# Patient Record
Sex: Female | Born: 1955
Health system: Southern US, Community
[De-identification: ages and names within clinical notes are randomized; demographics above are authoritative.]

## PROBLEM LIST (undated history)

## (undated) DIAGNOSIS — E119 Type 2 diabetes mellitus without complications: Secondary | ICD-10-CM

## (undated) DIAGNOSIS — I10 Essential (primary) hypertension: Secondary | ICD-10-CM

## (undated) DIAGNOSIS — E785 Hyperlipidemia, unspecified: Secondary | ICD-10-CM

## (undated) DIAGNOSIS — R7303 Prediabetes: Secondary | ICD-10-CM

## (undated) DIAGNOSIS — J45909 Unspecified asthma, uncomplicated: Secondary | ICD-10-CM

## (undated) HISTORY — DX: Type 2 diabetes mellitus without complications: E11.9

## (undated) HISTORY — DX: Hyperlipidemia, unspecified: E78.5

## (undated) HISTORY — PX: BREAST EXCISIONAL BIOPSY: SUR124

## (undated) HISTORY — PX: ABDOMINAL HYSTERECTOMY: SHX81

---

## 1997-06-30 ENCOUNTER — Ambulatory Visit (HOSPITAL_COMMUNITY): Admission: RE | Admit: 1997-06-30 | Discharge: 1997-06-30 | Payer: Self-pay | Admitting: Obstetrics and Gynecology

## 1997-08-14 ENCOUNTER — Inpatient Hospital Stay (HOSPITAL_COMMUNITY): Admission: RE | Admit: 1997-08-14 | Discharge: 1997-08-15 | Payer: Self-pay | Admitting: Obstetrics and Gynecology

## 1999-07-07 ENCOUNTER — Ambulatory Visit (HOSPITAL_COMMUNITY): Admission: RE | Admit: 1999-07-07 | Discharge: 1999-07-07 | Payer: Self-pay | Admitting: Gastroenterology

## 1999-07-08 ENCOUNTER — Ambulatory Visit (HOSPITAL_COMMUNITY): Admission: RE | Admit: 1999-07-08 | Discharge: 1999-07-08 | Payer: Self-pay | Admitting: Gastroenterology

## 1999-10-21 ENCOUNTER — Encounter: Payer: Self-pay | Admitting: Obstetrics and Gynecology

## 1999-10-21 ENCOUNTER — Ambulatory Visit (HOSPITAL_COMMUNITY): Admission: RE | Admit: 1999-10-21 | Discharge: 1999-10-21 | Payer: Self-pay | Admitting: Obstetrics and Gynecology

## 2000-11-26 ENCOUNTER — Other Ambulatory Visit: Admission: RE | Admit: 2000-11-26 | Discharge: 2000-11-26 | Payer: Self-pay | Admitting: Obstetrics and Gynecology

## 2002-06-27 ENCOUNTER — Encounter: Admission: RE | Admit: 2002-06-27 | Discharge: 2002-08-07 | Payer: Self-pay | Admitting: Internal Medicine

## 2002-09-11 ENCOUNTER — Encounter: Payer: Self-pay | Admitting: Obstetrics and Gynecology

## 2002-09-11 ENCOUNTER — Ambulatory Visit (HOSPITAL_COMMUNITY): Admission: RE | Admit: 2002-09-11 | Discharge: 2002-09-11 | Payer: Self-pay | Admitting: Obstetrics and Gynecology

## 2004-05-04 ENCOUNTER — Encounter: Admission: RE | Admit: 2004-05-04 | Discharge: 2004-05-04 | Payer: Self-pay | Admitting: Internal Medicine

## 2010-11-23 ENCOUNTER — Other Ambulatory Visit (HOSPITAL_COMMUNITY): Payer: Self-pay | Admitting: Obstetrics and Gynecology

## 2010-11-23 DIAGNOSIS — Z1231 Encounter for screening mammogram for malignant neoplasm of breast: Secondary | ICD-10-CM

## 2010-12-08 ENCOUNTER — Ambulatory Visit (HOSPITAL_COMMUNITY)
Admission: RE | Admit: 2010-12-08 | Discharge: 2010-12-08 | Disposition: A | Discharge status: Home / Self Care | Payer: BC Managed Care – PPO | Source: Ambulatory Visit | Attending: Obstetrics and Gynecology | Admitting: Obstetrics and Gynecology

## 2010-12-08 DIAGNOSIS — Z1231 Encounter for screening mammogram for malignant neoplasm of breast: Secondary | ICD-10-CM | POA: Insufficient documentation

## 2010-12-13 ENCOUNTER — Other Ambulatory Visit: Payer: Self-pay | Admitting: Obstetrics and Gynecology

## 2010-12-13 DIAGNOSIS — R928 Other abnormal and inconclusive findings on diagnostic imaging of breast: Secondary | ICD-10-CM

## 2010-12-19 ENCOUNTER — Ambulatory Visit
Admission: RE | Admit: 2010-12-19 | Discharge: 2010-12-19 | Disposition: A | Discharge status: Home / Self Care | Payer: BC Managed Care – PPO | Source: Ambulatory Visit | Attending: Obstetrics and Gynecology | Admitting: Obstetrics and Gynecology

## 2010-12-19 DIAGNOSIS — R928 Other abnormal and inconclusive findings on diagnostic imaging of breast: Secondary | ICD-10-CM

## 2011-07-10 ENCOUNTER — Other Ambulatory Visit: Payer: Self-pay | Admitting: Obstetrics and Gynecology

## 2011-07-10 DIAGNOSIS — Z09 Encounter for follow-up examination after completed treatment for conditions other than malignant neoplasm: Secondary | ICD-10-CM

## 2011-07-10 DIAGNOSIS — N63 Unspecified lump in unspecified breast: Secondary | ICD-10-CM

## 2011-08-02 ENCOUNTER — Ambulatory Visit
Admission: RE | Admit: 2011-08-02 | Discharge: 2011-08-02 | Disposition: A | Discharge status: Home / Self Care | Payer: BC Managed Care – PPO | Source: Ambulatory Visit | Attending: Obstetrics and Gynecology | Admitting: Obstetrics and Gynecology

## 2011-08-02 DIAGNOSIS — N63 Unspecified lump in unspecified breast: Secondary | ICD-10-CM

## 2014-07-03 ENCOUNTER — Other Ambulatory Visit: Payer: Self-pay | Admitting: Family

## 2014-07-03 ENCOUNTER — Ambulatory Visit
Admission: RE | Admit: 2014-07-03 | Discharge: 2014-07-03 | Disposition: A | Discharge status: Home / Self Care | Payer: BLUE CROSS/BLUE SHIELD | Source: Ambulatory Visit | Attending: Family | Admitting: Family

## 2014-07-03 DIAGNOSIS — M25562 Pain in left knee: Secondary | ICD-10-CM

## 2014-12-30 ENCOUNTER — Other Ambulatory Visit: Payer: Self-pay | Admitting: Obstetrics and Gynecology

## 2014-12-30 DIAGNOSIS — N63 Unspecified lump in unspecified breast: Secondary | ICD-10-CM

## 2015-01-07 ENCOUNTER — Other Ambulatory Visit: Payer: Self-pay | Admitting: Obstetrics and Gynecology

## 2015-01-07 ENCOUNTER — Ambulatory Visit
Admission: RE | Admit: 2015-01-07 | Discharge: 2015-01-07 | Disposition: A | Discharge status: Home / Self Care | Payer: BLUE CROSS/BLUE SHIELD | Source: Ambulatory Visit | Attending: Obstetrics and Gynecology | Admitting: Obstetrics and Gynecology

## 2015-01-07 DIAGNOSIS — N63 Unspecified lump in unspecified breast: Secondary | ICD-10-CM

## 2015-07-06 ENCOUNTER — Other Ambulatory Visit: Payer: Self-pay | Admitting: Family

## 2015-07-06 DIAGNOSIS — N63 Unspecified lump in unspecified breast: Secondary | ICD-10-CM

## 2015-07-12 ENCOUNTER — Ambulatory Visit
Admission: RE | Admit: 2015-07-12 | Discharge: 2015-07-12 | Disposition: A | Discharge status: Home / Self Care | Payer: BLUE CROSS/BLUE SHIELD | Source: Ambulatory Visit | Attending: Family | Admitting: Family

## 2015-07-12 DIAGNOSIS — N63 Unspecified lump in unspecified breast: Secondary | ICD-10-CM

## 2015-09-30 ENCOUNTER — Encounter: Payer: Self-pay | Admitting: Pediatrics

## 2016-02-01 ENCOUNTER — Other Ambulatory Visit: Payer: Self-pay | Admitting: Family

## 2016-02-01 DIAGNOSIS — N6320 Unspecified lump in the left breast, unspecified quadrant: Secondary | ICD-10-CM

## 2016-02-07 ENCOUNTER — Other Ambulatory Visit: Payer: Self-pay

## 2016-02-07 ENCOUNTER — Other Ambulatory Visit: Payer: Self-pay | Admitting: Family

## 2016-02-07 DIAGNOSIS — N6320 Unspecified lump in the left breast, unspecified quadrant: Secondary | ICD-10-CM

## 2016-02-08 ENCOUNTER — Ambulatory Visit
Admission: RE | Admit: 2016-02-08 | Discharge: 2016-02-08 | Disposition: A | Discharge status: Home / Self Care | Payer: BLUE CROSS/BLUE SHIELD | Source: Ambulatory Visit | Attending: Family | Admitting: Family

## 2016-02-08 DIAGNOSIS — N6320 Unspecified lump in the left breast, unspecified quadrant: Secondary | ICD-10-CM

## 2017-02-01 ENCOUNTER — Other Ambulatory Visit: Payer: Self-pay | Admitting: Family

## 2017-02-01 DIAGNOSIS — Z1231 Encounter for screening mammogram for malignant neoplasm of breast: Secondary | ICD-10-CM

## 2017-02-21 ENCOUNTER — Ambulatory Visit
Admission: RE | Admit: 2017-02-21 | Discharge: 2017-02-21 | Disposition: A | Discharge status: Home / Self Care | Payer: BLUE CROSS/BLUE SHIELD | Source: Ambulatory Visit | Attending: Family | Admitting: Family

## 2017-02-21 DIAGNOSIS — Z1231 Encounter for screening mammogram for malignant neoplasm of breast: Secondary | ICD-10-CM

## 2017-02-22 ENCOUNTER — Other Ambulatory Visit: Payer: Self-pay | Admitting: Family

## 2017-02-22 DIAGNOSIS — R928 Other abnormal and inconclusive findings on diagnostic imaging of breast: Secondary | ICD-10-CM

## 2017-02-27 ENCOUNTER — Ambulatory Visit
Admission: RE | Admit: 2017-02-27 | Discharge: 2017-02-27 | Disposition: A | Discharge status: Home / Self Care | Payer: BLUE CROSS/BLUE SHIELD | Source: Ambulatory Visit | Attending: Family | Admitting: Family

## 2017-02-27 ENCOUNTER — Ambulatory Visit: Payer: BLUE CROSS/BLUE SHIELD

## 2017-02-27 ENCOUNTER — Other Ambulatory Visit: Payer: Self-pay | Admitting: Family

## 2017-02-27 DIAGNOSIS — R928 Other abnormal and inconclusive findings on diagnostic imaging of breast: Secondary | ICD-10-CM

## 2017-02-27 DIAGNOSIS — N6320 Unspecified lump in the left breast, unspecified quadrant: Secondary | ICD-10-CM

## 2017-03-08 ENCOUNTER — Other Ambulatory Visit (HOSPITAL_COMMUNITY): Payer: Self-pay | Admitting: Internal Medicine

## 2017-03-08 DIAGNOSIS — R7989 Other specified abnormal findings of blood chemistry: Secondary | ICD-10-CM

## 2017-04-26 ENCOUNTER — Encounter (HOSPITAL_COMMUNITY)
Admission: RE | Admit: 2017-04-26 | Discharge: 2017-04-26 | Disposition: A | Discharge status: Home / Self Care | Payer: BLUE CROSS/BLUE SHIELD | Source: Ambulatory Visit | Attending: Internal Medicine | Admitting: Internal Medicine

## 2017-04-26 DIAGNOSIS — R7989 Other specified abnormal findings of blood chemistry: Secondary | ICD-10-CM | POA: Diagnosis not present

## 2017-04-27 ENCOUNTER — Encounter (HOSPITAL_COMMUNITY)
Admission: RE | Admit: 2017-04-27 | Discharge: 2017-04-27 | Disposition: A | Discharge status: Home / Self Care | Payer: BLUE CROSS/BLUE SHIELD | Source: Ambulatory Visit | Attending: Internal Medicine | Admitting: Internal Medicine

## 2017-04-27 MED ORDER — SODIUM PERTECHNETATE TC 99M INJECTION
10.0000 | Freq: Once | INTRAVENOUS | Status: AC | PRN
  Administered 2017-04-27: 10 via INTRAVENOUS

## 2017-04-27 MED ORDER — SODIUM IODIDE I 131 CAPSULE
11.9000 | Freq: Once | INTRAVENOUS | Status: AC | PRN
  Administered 2017-04-27: 11.9 via ORAL

## 2017-05-01 ENCOUNTER — Other Ambulatory Visit: Payer: Self-pay | Admitting: Internal Medicine

## 2017-05-01 DIAGNOSIS — E041 Nontoxic single thyroid nodule: Secondary | ICD-10-CM

## 2017-05-07 ENCOUNTER — Other Ambulatory Visit: Payer: BLUE CROSS/BLUE SHIELD

## 2017-05-14 ENCOUNTER — Ambulatory Visit
Admission: RE | Admit: 2017-05-14 | Discharge: 2017-05-14 | Disposition: A | Discharge status: Home / Self Care | Payer: BLUE CROSS/BLUE SHIELD | Source: Ambulatory Visit | Attending: Internal Medicine | Admitting: Internal Medicine

## 2017-05-14 DIAGNOSIS — E041 Nontoxic single thyroid nodule: Secondary | ICD-10-CM

## 2017-05-16 ENCOUNTER — Other Ambulatory Visit: Payer: Self-pay | Admitting: Internal Medicine

## 2017-05-16 DIAGNOSIS — E042 Nontoxic multinodular goiter: Secondary | ICD-10-CM

## 2017-06-08 ENCOUNTER — Other Ambulatory Visit (HOSPITAL_COMMUNITY)
Admission: RE | Admit: 2017-06-08 | Discharge: 2017-06-08 | Disposition: A | Discharge status: Home / Self Care | Payer: BLUE CROSS/BLUE SHIELD | Source: Ambulatory Visit | Attending: Student | Admitting: Student

## 2017-06-08 ENCOUNTER — Ambulatory Visit
Admission: RE | Admit: 2017-06-08 | Discharge: 2017-06-08 | Disposition: A | Discharge status: Home / Self Care | Payer: BLUE CROSS/BLUE SHIELD | Source: Ambulatory Visit | Attending: Internal Medicine | Admitting: Internal Medicine

## 2017-06-08 DIAGNOSIS — E042 Nontoxic multinodular goiter: Secondary | ICD-10-CM

## 2017-09-12 ENCOUNTER — Ambulatory Visit
Admission: RE | Admit: 2017-09-12 | Discharge: 2017-09-12 | Disposition: A | Discharge status: Home / Self Care | Payer: BLUE CROSS/BLUE SHIELD | Source: Ambulatory Visit | Attending: Nurse Practitioner | Admitting: Nurse Practitioner

## 2017-09-12 ENCOUNTER — Other Ambulatory Visit: Payer: Self-pay | Admitting: Nurse Practitioner

## 2017-09-12 DIAGNOSIS — M25561 Pain in right knee: Secondary | ICD-10-CM

## 2017-12-22 IMAGING — US US THYROID
1 series · 15 of 25 positions shown · non-contrast
Comparison: Nuclear medicine scan 04/27/2017

CLINICAL DATA: 61-year-old female with a history of left-sided
thyroid nodule identified on recent nuclear medicine thyroid scan as
a cold nodule

EXAM:
THYROID ULTRASOUND
TECHNIQUE: Ultrasound examination of the thyroid gland and adjacent soft
tissues was performed.

[Series 1: us thyroid · 0.08mm/px · 15 of 109 slices shown]
[im 1/109]
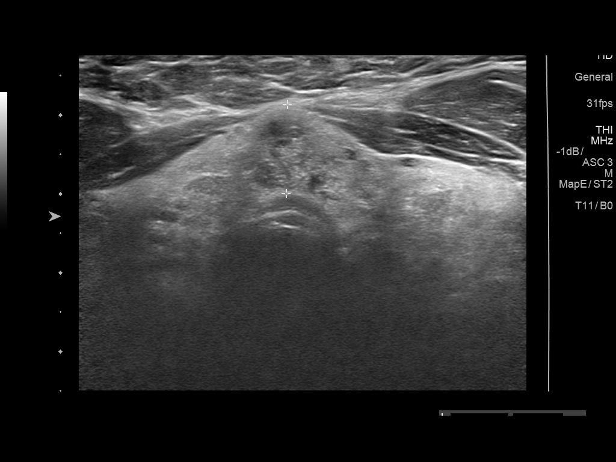
[im 10/109]
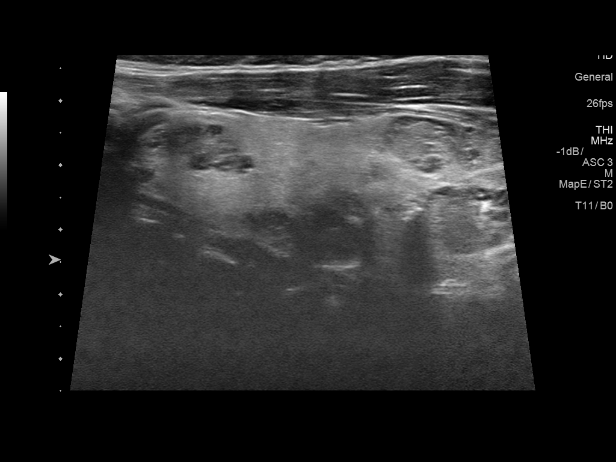
[im 19/109]
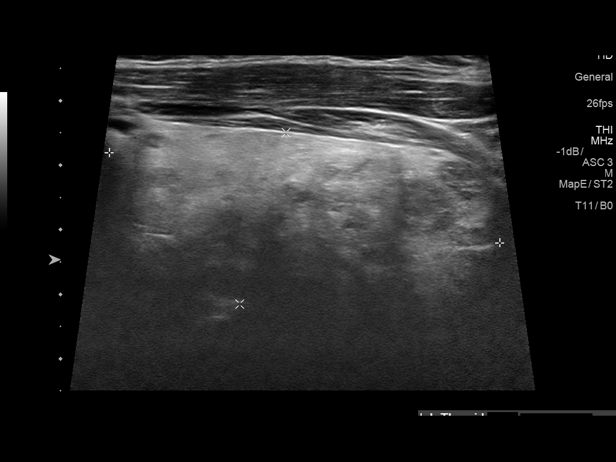
[im 23/109]
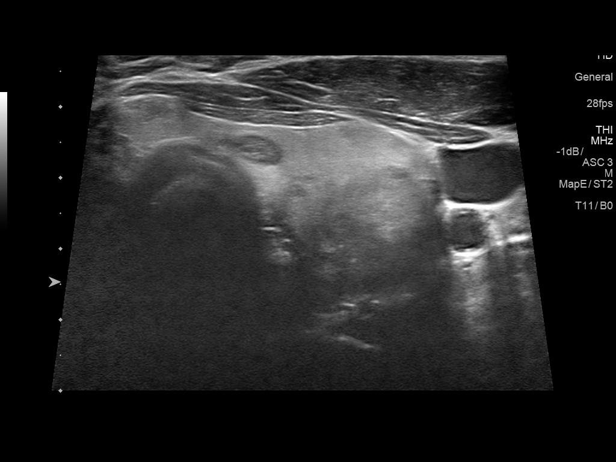
[im 32/109]
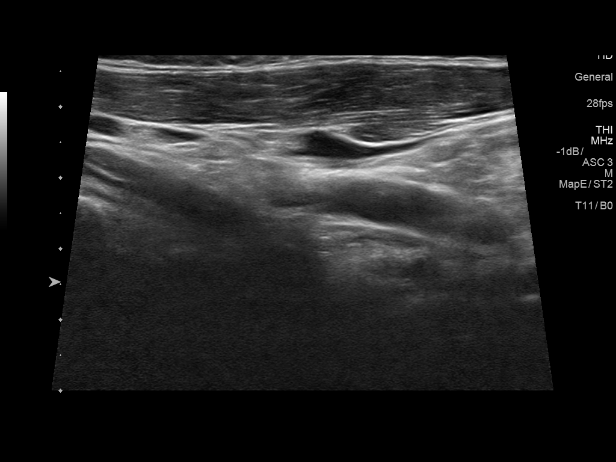
[im 41/109]
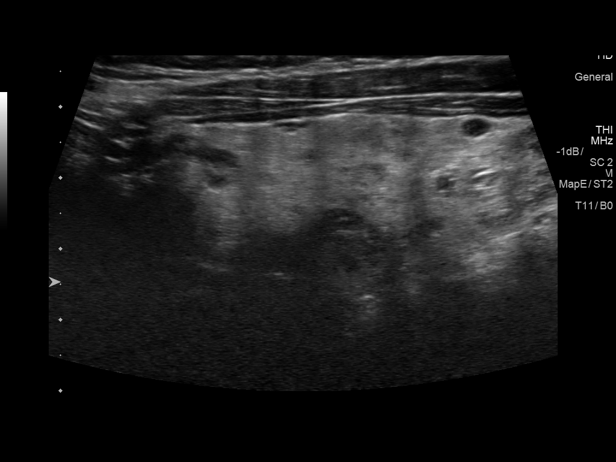
[im 46/109]
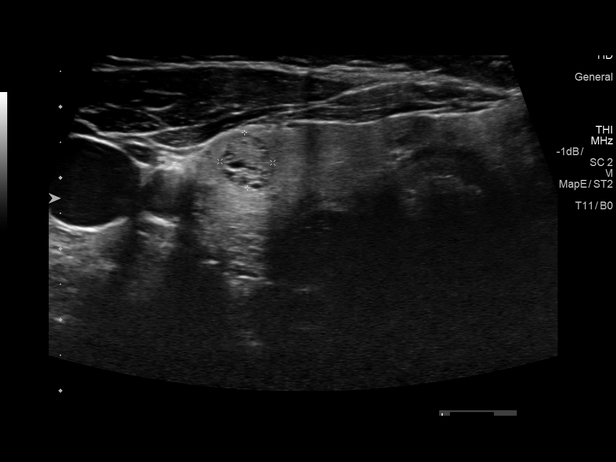
[im 55/109]
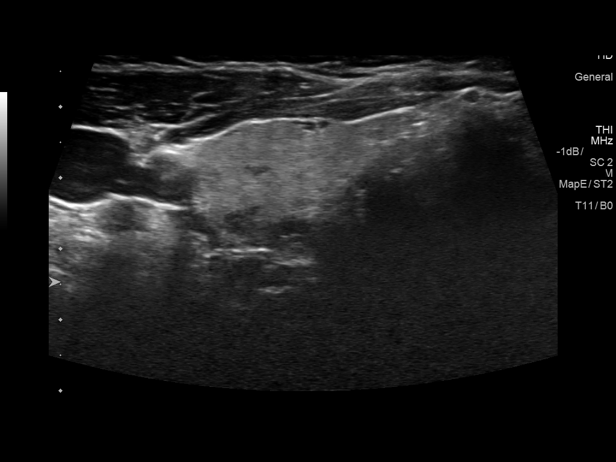
[im 64/109]
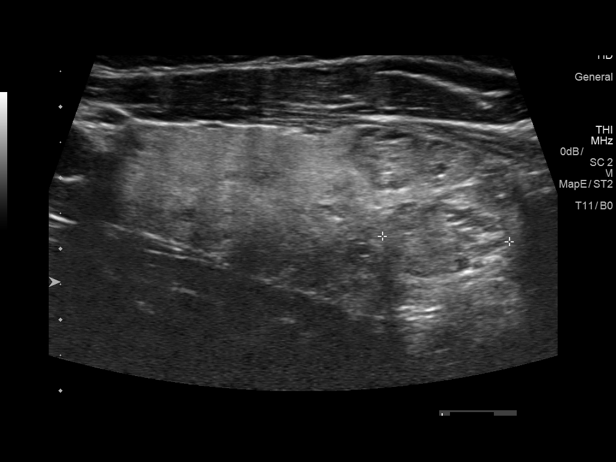
[im 68/109]
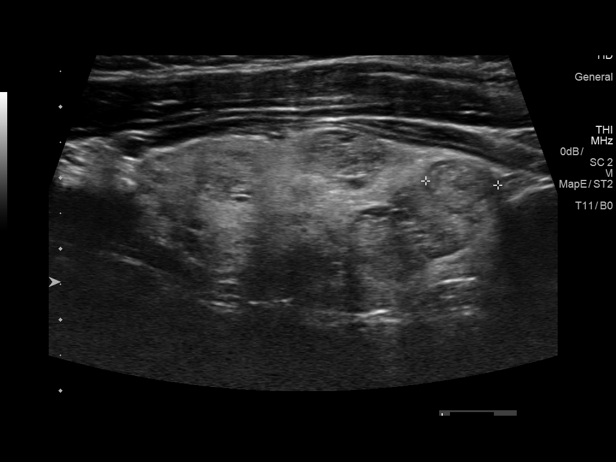
[im 77/109]
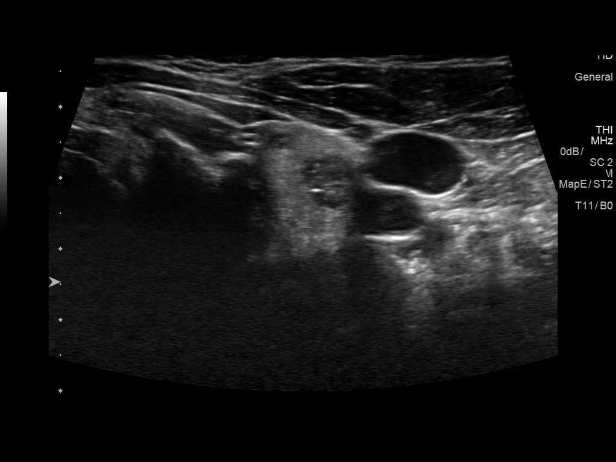
[im 86/109]
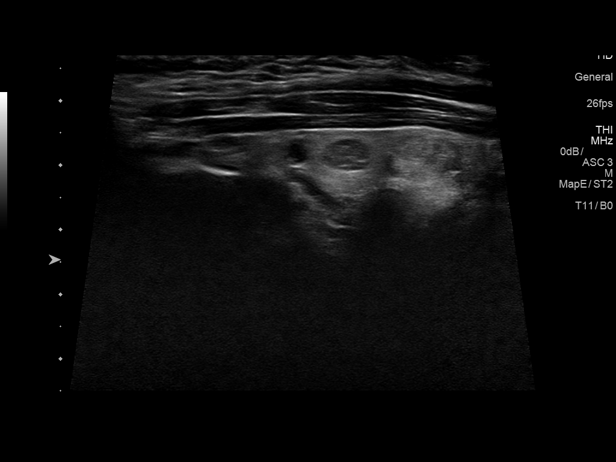
[im 91/109]
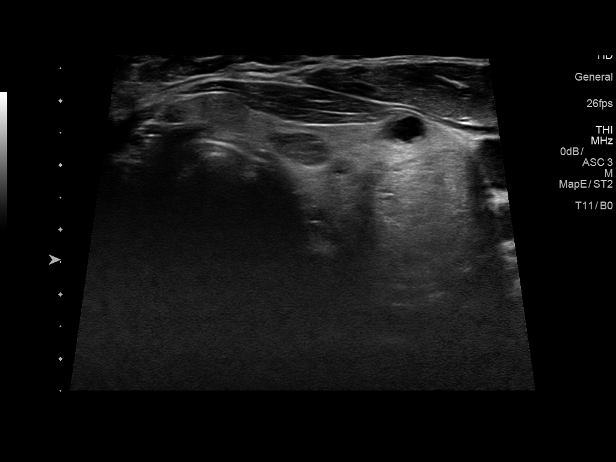
[im 100/109]
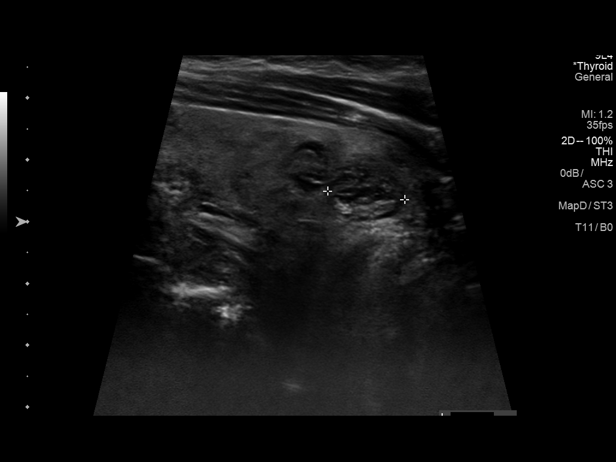
[im 109/109]
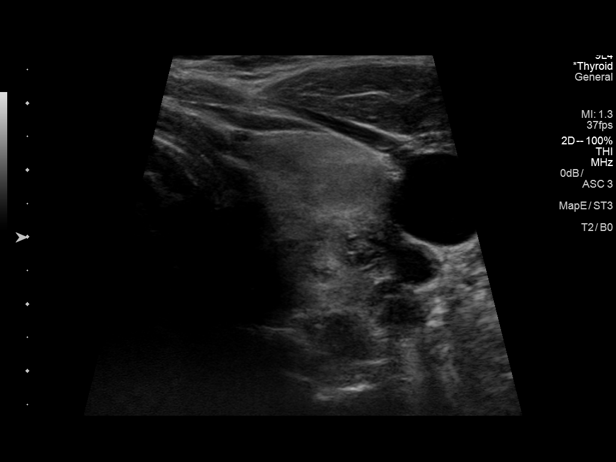

[15 of 25 positions shown; findings below may reference images not displayed]

FINDINGS: Parenchymal Echotexture: Mildly heterogenous

Isthmus: 1.1 cm

Right lobe: 6.5 cm x 2.3 cm x 2.6 cm

Left lobe: 6.2 cm x 2.8 cm x 2.7 cm

_________________________________________________________

Estimated total number of nodules >/= 1 cm: 6-10

Number of spongiform nodules >/=  2 cm not described below (TR1): 0

Number of mixed cystic and solid nodules >/= 1.5 cm not described
below (TR2): 0

_________________________________________________________

Nodule # 1:

Location: Isthmus; Mid

Maximum size: 0.6 cm; Other 2 dimensions: 0.4 cm x 0.6 cm

Composition: cannot determine (2)

Echogenicity: hypoechoic (2)

Shape: not taller-than-wide (0)

Margins: smooth (0)

Echogenic foci: none (0)

ACR TI-RADS total points: 4.

ACR TI-RADS risk category: TR4 (4-6 points).

ACR TI-RADS recommendations:

Nodule does not meet criteria for surveillance or biopsy

_________________________________________________________

Nodule # 2:

Location: Right; Superior

Maximum size: 1.2 cm; Other 2 dimensions: 0.5 cm x 0.8 cm

Composition: spongiform (0)

ACR TI-RADS recommendations:

Spongiform nodule does not meet criteria for surveillance or biopsy

_________________________________________________________

Nodule # 3:

Location: Right; Mid

Maximum size: 1.0 cm; Other 2 dimensions: 0.9 cm x 0.9 cm

Composition: cannot determine (2)

Echogenicity: hypoechoic (2)

Shape: not taller-than-wide (0)

Margins: ill-defined (0)

Echogenic foci: none (0)

ACR TI-RADS total points: 4.

ACR TI-RADS risk category: TR4 (4-6 points).

ACR TI-RADS recommendations:

Nodule meets criteria for surveillance

_________________________________________________________

Nodule # 4:

Location: Right; Inferior

Maximum size: 1.9 cm; Other 2 dimensions: 1.3 cm x 0.9 cm

Composition: solid/almost completely solid (2)

Echogenicity: isoechoic (1)

Shape: not taller-than-wide (0)

Margins: ill-defined (0)

Echogenic foci: none (0)

ACR TI-RADS total points: 3.

ACR TI-RADS risk category: TR3 (3 points).

ACR TI-RADS recommendations:

Nodule meets criteria for surveillance

_________________________________________________________

Nodule # 5:

Location: Right; Inferior

Maximum size: 1.8 cm; Other 2 dimensions: 1.0 cm x 1.6 cm

Composition: solid/almost completely solid (2)

Echogenicity: isoechoic (1)

Shape: not taller-than-wide (0)

Margins: ill-defined (0)

Echogenic foci: macrocalcifications (1)

ACR TI-RADS total points: 4.

ACR TI-RADS risk category: TR4 (4-6 points).

ACR TI-RADS recommendations:

Nodule meets criteria for biopsy

_________________________________________________________

Nodule # 6:

Location: Left; Superior

Maximum size: 0.8 cm; Other 2 dimensions: 0.8 cm x 0.8 cm

Composition: cannot determine (2)

Echogenicity: hypoechoic (2)

Shape: not taller-than-wide (0)

Margins: smooth (0)

Echogenic foci: none (0)

ACR TI-RADS total points: 4.

ACR TI-RADS risk category: TR4 (4-6 points).

ACR TI-RADS recommendations:

Nodule does not meet criteria for surveillance or biopsy

_________________________________________________________

Nodule # 7:

Location: Left; Mid

Maximum size: 1.6 cm; Other 2 dimensions: 1.2 cm x 1.2 cm

Composition: solid/almost completely solid (2)

Echogenicity: isoechoic (1)

Shape: not taller-than-wide (0)

Margins: ill-defined (0)

Echogenic foci: punctate echogenic foci (3)

ACR TI-RADS total points: 6.

ACR TI-RADS risk category: TR4 (4-6 points).

ACR TI-RADS recommendations:

Nodule meets criteria for biopsy

_________________________________________________________

Nodule # 8:

Location: Left; Mid

Maximum size: 2.2 cm; Other 2 dimensions: 1.7 cm x 2.0 cm

Composition: solid/almost completely solid (2)

Echogenicity: isoechoic (1)

Shape: not taller-than-wide (0)

Margins: ill-defined (0)

Echogenic foci: none (0)

ACR TI-RADS total points: 3.

ACR TI-RADS risk category: TR3 (3 points).

ACR TI-RADS recommendations:

Nodule meets criteria for surveillance

_________________________________________________________

Nodule # 9:

Location: Left; Inferior

Maximum size: 1.3 cm; Other 2 dimensions: 1.2 cm x 1.2 cm

Composition: spongiform (0)

ACR TI-RADS recommendations:

Spongiform nodule does not meet criteria for surveillance or biopsy

_________________________________________________________

No adenopathy
IMPRESSION: Two separate thyroid nodules meet criteria for biopsy, as designated
by the newly established ACR TI-RADS criteria, and referral for
biopsy is recommended. The first is the presumed cold nodule in the
mid left thyroid gland (labeled 7). The second is inferior right
thyroid nodule (labeled 5).

Three separate thyroid nodules meet criteria for surveillance, as
designated by the newly established ACR TI-RADS criteria.
Surveillance ultrasound study recommended to be performed annually
up to 5 years. Two on the right (labeled 3 and 4) and single left
(labeled 8).

Remainder of the nodules do not meet criteria for surveillance or
biopsy.

Recommendations follow those established by the new ACR TI-RADS
criteria ([HOSPITAL] 4297;[DATE]).

## 2018-03-04 ENCOUNTER — Other Ambulatory Visit: Payer: Self-pay | Admitting: Family

## 2018-03-04 DIAGNOSIS — Z1231 Encounter for screening mammogram for malignant neoplasm of breast: Secondary | ICD-10-CM

## 2018-03-04 DIAGNOSIS — R928 Other abnormal and inconclusive findings on diagnostic imaging of breast: Secondary | ICD-10-CM

## 2018-03-12 ENCOUNTER — Ambulatory Visit
Admission: RE | Admit: 2018-03-12 | Discharge: 2018-03-12 | Disposition: A | Discharge status: Home / Self Care | Payer: BLUE CROSS/BLUE SHIELD | Source: Ambulatory Visit | Attending: Family | Admitting: Family

## 2018-03-12 ENCOUNTER — Other Ambulatory Visit: Payer: BLUE CROSS/BLUE SHIELD

## 2018-03-12 DIAGNOSIS — Z1231 Encounter for screening mammogram for malignant neoplasm of breast: Secondary | ICD-10-CM

## 2018-06-04 ENCOUNTER — Emergency Department (HOSPITAL_COMMUNITY): Payer: BLUE CROSS/BLUE SHIELD

## 2018-06-04 ENCOUNTER — Emergency Department (HOSPITAL_COMMUNITY)
Admission: EM | Admit: 2018-06-04 | Discharge: 2018-06-05 | Disposition: A | Discharge status: Home / Self Care | Payer: BLUE CROSS/BLUE SHIELD | Attending: Emergency Medicine | Admitting: Emergency Medicine

## 2018-06-04 ENCOUNTER — Encounter (HOSPITAL_COMMUNITY): Payer: Self-pay | Admitting: Emergency Medicine

## 2018-06-04 DIAGNOSIS — R05 Cough: Secondary | ICD-10-CM | POA: Insufficient documentation

## 2018-06-04 DIAGNOSIS — R0789 Other chest pain: Secondary | ICD-10-CM | POA: Diagnosis not present

## 2018-06-04 DIAGNOSIS — R059 Cough, unspecified: Secondary | ICD-10-CM

## 2018-06-04 LAB — CBC
HCT: 40.2 % (ref 36.0–46.0)
Hemoglobin: 12.8 g/dL (ref 12.0–15.0)
MCH: 29.2 pg (ref 26.0–34.0)
MCHC: 31.8 g/dL (ref 30.0–36.0)
MCV: 91.8 fL (ref 80.0–100.0)
PLATELETS: 285 10*3/uL (ref 150–400)
RBC: 4.38 MIL/uL (ref 3.87–5.11)
RDW: 13.8 % (ref 11.5–15.5)
WBC: 9.1 10*3/uL (ref 4.0–10.5)
nRBC: 0 % (ref 0.0–0.2)

## 2018-06-04 LAB — I-STAT TROPONIN, ED: Troponin i, poc: 0 ng/mL (ref 0.00–0.08)

## 2018-06-04 LAB — BASIC METABOLIC PANEL
Anion gap: 10 (ref 5–15)
BUN: 9 mg/dL (ref 8–23)
CALCIUM: 9.9 mg/dL (ref 8.9–10.3)
CO2: 27 mmol/L (ref 22–32)
Chloride: 102 mmol/L (ref 98–111)
Creatinine, Ser: 0.71 mg/dL (ref 0.44–1.00)
GFR calc Af Amer: >60 mL/min (ref >60)
GFR calc non Af Amer: >60 mL/min (ref >60)
Glucose, Bld: 131 mg/dL — ABNORMAL HIGH (ref 70–99)
Potassium: 3.7 mmol/L (ref 3.5–5.1)
Sodium: 139 mmol/L (ref 135–145)

## 2018-06-04 NOTE — ED Triage Notes (Signed)
Pt reports substernal chest pain when she is coughing.  Pt has had a productive cough for 2 weeks.

## 2018-06-05 MED ORDER — BENZONATATE 100 MG PO CAPS: 200.0000 mg | ORAL_CAPSULE | Freq: Two times a day (BID) | ORAL | 0 refills | Status: DC | PRN

## 2018-06-05 MED ORDER — HYDROCODONE-HOMATROPINE 5-1.5 MG/5ML PO SYRP: 5.0000 mL | ORAL_SOLUTION | Freq: Four times a day (QID) | ORAL | 0 refills | Status: DC | PRN

## 2018-06-05 NOTE — ED Provider Notes (Signed)
[facility] EMERGENCY DEPARTMENT Provider Note   CSN: [id number] Arrival date & time: 06/04/18  2216     History   Chief Complaint Chief Complaint  Patient presents with  . Cough  . Chest Pain    HPI Donna Cruz is a 63 y.o. female.  Patient presents to the emergency department with a chief complaint of cough x2 weeks.  She denies any associated fevers or chills.  Denies any productive cough.  Denies any shortness of breath.  She states that when she coughs it causes her chest to hurt.  She also states that she was lifting something the other day, and may have injured her chest.  She has tried taking OTC cough and cold medicines with little relief.  Symptoms are also worsened with palpation of her chest wall.  Denies any history of ACS, PE, or DVT.  The history is provided by the patient. No language interpreter was used.    History reviewed. No pertinent past medical history.  There are no active problems to display for this patient.   Past Surgical History:  Procedure Laterality Date  . BREAST EXCISIONAL BIOPSY Bilateral    benign     OB History   No obstetric history on file.      Home Medications    Prior to Admission medications   Not on File    Family History No family history on file.  Social History Social History   Tobacco Use  . Smoking status: Not on file  Substance Use Topics  . Alcohol use: Not on file  . Drug use: Not on file     Allergies   Patient has no known allergies.   Review of Systems Review of Systems  All other systems reviewed and are negative.    Physical Exam Updated Vital Signs BP (!) 175/81   Pulse 92   Temp 98.4 F (36.9 C) (Oral)   Resp 19   SpO2 99%   Physical Exam Vitals signs and nursing note reviewed.  Constitutional:      Appearance: She is well-developed.  HENT:     Head: Normocephalic and atraumatic.  Eyes:     Conjunctiva/sclera: Conjunctivae normal.     Pupils:  Pupils are equal, round, and reactive to light.  Neck:     Musculoskeletal: Normal range of motion and neck supple.  Cardiovascular:     Rate and Rhythm: Normal rate and regular rhythm.     Heart sounds: No murmur. No friction rub. No gallop.      Comments: Anterior chest wall tender to palpation Pulmonary:     Effort: Pulmonary effort is normal. No respiratory distress.     Breath sounds: Normal breath sounds. No wheezing or rales.     Comments: Lungs are clear to auscultation Chest:     Chest wall: No tenderness.  Abdominal:     General: Bowel sounds are normal. There is no distension.     Palpations: Abdomen is soft. There is no mass.     Tenderness: There is no abdominal tenderness. There is no guarding or rebound.  Musculoskeletal: Normal range of motion.        General: No tenderness.  Skin:    General: Skin is warm and dry.  Neurological:     Mental Status: She is alert and oriented to person, place, and time.  Psychiatric:        Behavior: Behavior normal.        Thought Content:  Thought content normal.        Judgment: Judgment normal.      ED Treatments / Results  Labs (all labs ordered are listed, but only abnormal results are displayed) Labs Reviewed  BASIC METABOLIC PANEL - Abnormal; Notable for the following components:      Result Value   Glucose, Bld 131 (*)    All other components within normal limits  CBC  I-STAT TROPONIN, ED    EKG None  Radiology Dg Chest 2 View  Result Date: 06/04/2018 CLINICAL DATA:  Chest pain cough and congestion EXAM: CHEST - 2 VIEW COMPARISON:  None. FINDINGS: The heart size and mediastinal contours are within normal limits. Both lungs are clear. Mild aortic atherosclerosis. Mild degenerative changes of the spine. IMPRESSION: No active cardiopulmonary disease. Electronically Signed   By: Donavan Foil M.D.   On: 06/04/2018 22:52    Procedures Procedures (including critical care time)  Medications Ordered in  ED Medications - No data to display   Initial Impression / Assessment and Plan / ED Course  I have reviewed the triage vital signs and the nursing notes.  Pertinent labs & imaging results that were available during my care of the patient were reviewed by me and considered in my medical decision making (see chart for details).     Patient with cough x2 weeks.  The cough causes her chest to hurt.  Her laboratory work-up is reassuring.  Troponin is 0.00.  No ischemic changes on EKG.  Electrolytes and CBC are normal.  Vital signs are stable.  Patient is not hypoxic, nor tachycardic, doubt PE. Doubt ACS.  Symptoms are easily reproducible with palpation of her anterior chest wall.  I believe her symptoms to be musculoskeletal, and secondary to her coughing.  Will prescribe Tessalon Perles.  She is taking meloxicam.  Advised her to follow-up with her PCP.  I do not believe that any further emergent work-up is needed.  Final Clinical Impressions(s) / ED Diagnoses   Final diagnoses:  Cough  Chest wall pain    ED Discharge Orders         Ordered    HYDROcodone-homatropine (HYCODAN) 5-1.5 MG/5ML syrup  Every 6 hours PRN     06/05/18 0524    benzonatate (TESSALON) 100 MG capsule  2 times daily PRN     06/05/18 0524           Montine Circle, PA-C 06/05/18 0525    Ward, Delice Bison, DO 06/05/18 [phone]

## 2018-06-05 NOTE — ED Notes (Signed)
Patient verbalizes understanding of discharge instructions. Opportunity for questioning and answers were provided. Armband removed by staff, pt discharged from ED. Ambulated out to lobby with family ? ?

## 2018-07-01 DIAGNOSIS — Z6841 Body Mass Index (BMI) 40.0 and over, adult: Secondary | ICD-10-CM | POA: Diagnosis not present

## 2018-07-01 DIAGNOSIS — E079 Disorder of thyroid, unspecified: Secondary | ICD-10-CM | POA: Diagnosis not present

## 2018-07-01 DIAGNOSIS — I10 Essential (primary) hypertension: Secondary | ICD-10-CM | POA: Diagnosis not present

## 2018-07-01 DIAGNOSIS — M25511 Pain in right shoulder: Secondary | ICD-10-CM | POA: Diagnosis not present

## 2018-07-01 DIAGNOSIS — Z79899 Other long term (current) drug therapy: Secondary | ICD-10-CM | POA: Diagnosis not present

## 2018-07-15 DIAGNOSIS — R946 Abnormal results of thyroid function studies: Secondary | ICD-10-CM | POA: Diagnosis not present

## 2018-07-15 DIAGNOSIS — E042 Nontoxic multinodular goiter: Secondary | ICD-10-CM | POA: Diagnosis not present

## 2018-07-31 ENCOUNTER — Ambulatory Visit
Admission: RE | Admit: 2018-07-31 | Discharge: 2018-07-31 | Disposition: A | Discharge status: Home / Self Care | Payer: Medicare Other | Source: Ambulatory Visit | Attending: Family | Admitting: Family

## 2018-07-31 ENCOUNTER — Other Ambulatory Visit: Payer: Self-pay | Admitting: Family

## 2018-07-31 DIAGNOSIS — R269 Unspecified abnormalities of gait and mobility: Secondary | ICD-10-CM

## 2018-07-31 DIAGNOSIS — M25572 Pain in left ankle and joints of left foot: Secondary | ICD-10-CM

## 2018-07-31 DIAGNOSIS — M7662 Achilles tendinitis, left leg: Secondary | ICD-10-CM | POA: Diagnosis not present

## 2018-09-27 DIAGNOSIS — Z8349 Family history of other endocrine, nutritional and metabolic diseases: Secondary | ICD-10-CM | POA: Diagnosis not present

## 2018-09-27 DIAGNOSIS — R946 Abnormal results of thyroid function studies: Secondary | ICD-10-CM | POA: Diagnosis not present

## 2018-09-27 DIAGNOSIS — E042 Nontoxic multinodular goiter: Secondary | ICD-10-CM | POA: Diagnosis not present

## 2018-12-20 DIAGNOSIS — Z20828 Contact with and (suspected) exposure to other viral communicable diseases: Secondary | ICD-10-CM | POA: Diagnosis not present

## 2019-03-20 DIAGNOSIS — E038 Other specified hypothyroidism: Secondary | ICD-10-CM | POA: Diagnosis not present

## 2019-04-21 DIAGNOSIS — I10 Essential (primary) hypertension: Secondary | ICD-10-CM | POA: Diagnosis not present

## 2019-04-21 DIAGNOSIS — E069 Thyroiditis, unspecified: Secondary | ICD-10-CM | POA: Diagnosis not present

## 2019-07-09 DIAGNOSIS — R946 Abnormal results of thyroid function studies: Secondary | ICD-10-CM | POA: Diagnosis not present

## 2019-07-09 DIAGNOSIS — E042 Nontoxic multinodular goiter: Secondary | ICD-10-CM | POA: Diagnosis not present

## 2019-07-17 DIAGNOSIS — R002 Palpitations: Secondary | ICD-10-CM | POA: Diagnosis not present

## 2019-07-17 DIAGNOSIS — E0590 Thyrotoxicosis, unspecified without thyrotoxic crisis or storm: Secondary | ICD-10-CM | POA: Diagnosis not present

## 2019-07-17 DIAGNOSIS — R1310 Dysphagia, unspecified: Secondary | ICD-10-CM | POA: Diagnosis not present

## 2019-07-17 DIAGNOSIS — Z8349 Family history of other endocrine, nutritional and metabolic diseases: Secondary | ICD-10-CM | POA: Diagnosis not present

## 2019-07-17 DIAGNOSIS — E042 Nontoxic multinodular goiter: Secondary | ICD-10-CM | POA: Diagnosis not present

## 2019-08-19 ENCOUNTER — Ambulatory Visit: Payer: Self-pay | Admitting: Surgery

## 2019-08-19 DIAGNOSIS — E0590 Thyrotoxicosis, unspecified without thyrotoxic crisis or storm: Secondary | ICD-10-CM | POA: Diagnosis not present

## 2019-08-19 DIAGNOSIS — E049 Nontoxic goiter, unspecified: Secondary | ICD-10-CM | POA: Diagnosis not present

## 2019-08-19 DIAGNOSIS — E042 Nontoxic multinodular goiter: Secondary | ICD-10-CM | POA: Diagnosis not present

## 2019-08-27 NOTE — Patient Instructions (Signed)
DUE TO COVID-19 ONLY ONE VISITOR IS ALLOWED TO COME WITH YOU AND STAY IN THE WAITING ROOM ONLY DURING PRE OP AND PROCEDURE DAY OF SURGERY. THE 1 VISITOR MAY VISIT WITH YOU AFTER SURGERY IN YOUR PRIVATE ROOM DURING VISITING HOURS ONLY!  YOU NEED TO HAVE A COVID 19 TEST ON :08/28/19 @11 :00 am, THIS TEST MUST BE DONE BEFORE SURGERY, COME  [place], [place] , [address].  ([place]) ONCE YOUR COVID TEST IS COMPLETED, PLEASE BEGIN THE QUARANTINE INSTRUCTIONS AS OUTLINED IN YOUR HANDOUT.                Donna Cruz    Your procedure is scheduled on: 09/01/19   Report to [facility] Main  Entrance   Report to admitting at: 11:45 AM     Call this number if you have problems the morning of surgery [phone]    Remember: Do not eat solid food :After Midnight. Clear liquid diet from midnight until 10:45 am.   CLEAR LIQUID DIET   Foods Allowed                                                                     Foods Excluded  Coffee and tea, regular and decaf                             liquids that you cannot  Plain Jell-O any favor except red or purple                                           see through such as: Fruit ices (not with fruit pulp)                                     milk, soups, orange juice  Iced Popsicles                                    All solid food Carbonated beverages, regular and diet                                    Cranberry, grape and apple juices Sports drinks like Gatorade Lightly seasoned clear broth or consume(fat free) Sugar, honey syrup  Sample Menu Breakfast                                Lunch                                     Supper Cranberry juice                    Beef broth  Chicken broth Jell-O                                     Grape juice                           Apple juice Coffee or tea                        Jell-O                                      Popsicle                                                 Coffee or tea                        Coffee or tea  _____________________________________________________________________   Take these medicines the morning of surgery with A SIP OF WATER: N/A  BRUSH YOUR TEETH MORNING OF SURGERY AND RINSE YOUR MOUTH OUT, NO CHEWING GUM CANDY OR MINTS.                               You may not have any metal on your body including hair pins and              piercings  Do not wear jewelry, make-up, lotions, powders or perfumes, deodorant             Do not wear nail polish on your fingernails.  Do not shave  48 hours prior to surgery.                Do not bring valuables to the hospital. [place].  Contacts, dentures or bridgework may not be worn into surgery.  Leave suitcase in the car. After surgery it may be brought to your room.     Patients discharged the day of surgery will not be allowed to drive home. IF YOU ARE HAVING SURGERY AND GOING HOME THE SAME DAY, YOU MUST HAVE AN ADULT TO DRIVE YOU HOME AND BE WITH YOU FOR 24 HOURS. YOU MAY GO HOME BY TAXI OR UBER OR ORTHERWISE, BUT AN ADULT MUST ACCOMPANY YOU HOME AND STAY WITH YOU FOR 24 HOURS.  Name and phone number of your driver:  Special Instructions: N/A              Please read over the following fact sheets you were give        [facility] - Preparing for Surgery Before surgery, you can play an important role.  Because skin is not sterile, your skin needs to be as free of germs as possible.  You can reduce the number of germs on your skin by washing with CHG (chlorahexidine gluconate) soap before surgery.  CHG is an antiseptic cleaner which kills germs and bonds with the skin to continue killing germs even after washing. Please DO NOT use if  you have an allergy to CHG or antibacterial soaps.  If your skin becomes reddened/irritated stop using the CHG and inform your nurse when you arrive at Short Stay. Do  not shave (including legs and underarms) for at least 48 hours prior to the first CHG shower.  You may shave your face/neck. Please follow these instructions carefully:  1.  Shower with CHG Soap the night before surgery and the  morning of Surgery.  2.  If you choose to wash your hair, wash your hair first as usual with your  normal  shampoo.  3.  After you shampoo, rinse your hair and body thoroughly to remove the  shampoo.                           4.  Use CHG as you would any other liquid soap.  You can apply chg directly  to the skin and wash                       Gently with a scrungie or clean washcloth.  5.  Apply the CHG Soap to your body ONLY FROM THE NECK DOWN.   Do not use on face/ open                           Wound or open sores. Avoid contact with eyes, ears mouth and genitals (private parts).                       Wash face,  Genitals (private parts) with your normal soap.             6.  Wash thoroughly, paying special attention to the area where your surgery  will be performed.  7.  Thoroughly rinse your body with warm water from the neck down.  8.  DO NOT shower/wash with your normal soap after using and rinsing off  the CHG Soap.                9.  Pat yourself dry with a clean towel.            10.  Wear clean pajamas.            11.  Place clean sheets on your bed the night of your first shower and do not  sleep with pets. Day of Surgery : Do not apply any lotions/deodorants the morning of surgery.  Please wear clean clothes to the hospital/surgery center.  FAILURE TO FOLLOW THESE INSTRUCTIONS MAY RESULT IN THE CANCELLATION OF YOUR SURGERY PATIENT SIGNATURE_________________________________  NURSE SIGNATURE__________________________________  ________________________________________________________________________

## 2019-08-28 ENCOUNTER — Ambulatory Visit (HOSPITAL_COMMUNITY)
Admission: RE | Admit: 2019-08-28 | Discharge: 2019-08-28 | Disposition: A | Discharge status: Home / Self Care | Payer: Medicare Other | Source: Ambulatory Visit | Attending: Anesthesiology | Admitting: Anesthesiology

## 2019-08-28 ENCOUNTER — Encounter (HOSPITAL_COMMUNITY)
Admission: RE | Admit: 2019-08-28 | Discharge: 2019-08-28 | Disposition: A | Discharge status: Home / Self Care | Payer: Medicare Other | Source: Ambulatory Visit | Attending: Surgery | Admitting: Surgery

## 2019-08-28 ENCOUNTER — Other Ambulatory Visit: Payer: Self-pay

## 2019-08-28 ENCOUNTER — Other Ambulatory Visit (HOSPITAL_COMMUNITY)
Admission: RE | Admit: 2019-08-28 | Discharge: 2019-08-28 | Disposition: A | Discharge status: Home / Self Care | Payer: Medicare Other | Source: Ambulatory Visit | Attending: Surgery | Admitting: Surgery

## 2019-08-28 ENCOUNTER — Encounter (HOSPITAL_COMMUNITY): Payer: Self-pay

## 2019-08-28 DIAGNOSIS — I10 Essential (primary) hypertension: Secondary | ICD-10-CM | POA: Insufficient documentation

## 2019-08-28 DIAGNOSIS — E079 Disorder of thyroid, unspecified: Secondary | ICD-10-CM | POA: Diagnosis not present

## 2019-08-28 DIAGNOSIS — Z01818 Encounter for other preprocedural examination: Secondary | ICD-10-CM | POA: Diagnosis not present

## 2019-08-28 DIAGNOSIS — Z20822 Contact with and (suspected) exposure to covid-19: Secondary | ICD-10-CM | POA: Insufficient documentation

## 2019-08-28 HISTORY — DX: Unspecified asthma, uncomplicated: J45.909

## 2019-08-28 HISTORY — DX: Essential (primary) hypertension: I10

## 2019-08-28 HISTORY — DX: Prediabetes: R73.03

## 2019-08-28 LAB — CBC
HCT: 44 % (ref 36.0–46.0)
Hemoglobin: 14.1 g/dL (ref 12.0–15.0)
MCH: 29.2 pg (ref 26.0–34.0)
MCHC: 32 g/dL (ref 30.0–36.0)
MCV: 91.1 fL (ref 80.0–100.0)
Platelets: 251 10*3/uL (ref 150–400)
RBC: 4.83 MIL/uL (ref 3.87–5.11)
RDW: 14.1 % (ref 11.5–15.5)
WBC: 5.8 10*3/uL (ref 4.0–10.5)
nRBC: 0 % (ref 0.0–0.2)

## 2019-08-28 LAB — BASIC METABOLIC PANEL
Anion gap: 9 (ref 5–15)
BUN: 10 mg/dL (ref 8–23)
CO2: 25 mmol/L (ref 22–32)
Calcium: 9.6 mg/dL (ref 8.9–10.3)
Chloride: 106 mmol/L (ref 98–111)
Creatinine, Ser: 0.67 mg/dL (ref 0.44–1.00)
GFR calc Af Amer: >60 mL/min (ref >60)
GFR calc non Af Amer: >60 mL/min (ref >60)
Glucose, Bld: 93 mg/dL (ref 70–99)
Potassium: 4 mmol/L (ref 3.5–5.1)
Sodium: 140 mmol/L (ref 135–145)

## 2019-08-28 LAB — SARS CORONAVIRUS 2 (TAT 6-24 HRS): SARS Coronavirus 2: NEGATIVE

## 2019-08-28 NOTE — Progress Notes (Signed)
PCP - Burt Ek. T. FNP Cardiologist -   Chest x-ray -  EKG -  Stress Test -  ECHO -  Cardiac Cath -   Sleep Study -  CPAP -   Fasting Blood Sugar -  Checks Blood Sugar _____ times a day  Blood Thinner Instructions: Aspirin Instructions: Last Dose:  Anesthesia review:   Patient denies shortness of breath, fever, cough and chest pain at PAT appointment   Patient verbalized understanding of instructions that were given to them at the PAT appointment. Patient was also instructed that they will need to review over the PAT instructions again at home before surgery.

## 2019-08-31 ENCOUNTER — Encounter (HOSPITAL_COMMUNITY): Payer: Self-pay | Admitting: Surgery

## 2019-08-31 DIAGNOSIS — E042 Nontoxic multinodular goiter: Secondary | ICD-10-CM | POA: Diagnosis present

## 2019-08-31 DIAGNOSIS — E049 Nontoxic goiter, unspecified: Secondary | ICD-10-CM | POA: Diagnosis present

## 2019-08-31 DIAGNOSIS — E0590 Thyrotoxicosis, unspecified without thyrotoxic crisis or storm: Secondary | ICD-10-CM | POA: Diagnosis present

## 2019-08-31 MED ORDER — DEXTROSE 5 % IV SOLN
3.0000 g | INTRAVENOUS | Status: AC
  Administered 2019-09-01: 3 g via INTRAVENOUS
  Filled 2019-08-31: qty 3

## 2019-08-31 NOTE — H&P (Signed)
[facility]   Donna Cruz DOB: 1955-07-22 Married / Language: English / Race: Black or African American Female   History of Present Illness   The patient is a 64 year old female who presents with a thyroid nodule.  CHIEF COMPLAINT: multinodular goiter, hyperthyroidism  The patient is referred by Dr. Dagmar Hait for surgical evaluation and management of multinodular thyroid goiter with compressive symptoms and hyperthyroidism. Patient was diagnosed by her primary care provider a few years ago. She has been followed with ultrasound examinations. These show an enlarged thyroid gland with bilateral thyroid nodules. Patient underwent fine-needle aspiration biopsy in 2019 of 2 of these nodules. Both were benign. The patient and I reviewed her ultrasound report and her biopsy results today. Patient has been followed with laboratory studies. She remains hyperthyroid with a suppressed TSH level of 0.02. She is not on thyroid medication or anti-thyroid medication. She has had no prior head or neck surgery. There is a family history of hyperthyroidism in 2 of the patient's sisters. Patient has developed mild compressive symptoms including occasional dysphagia and persistent globus sensation. She discussed management with Dr. Buddy Duty and decided on thyroidectomy for management of multinodular goiter and hyperthyroidism. She presents today for surgical evaluation. Patient is retired from the school system.   Past Surgical History  Breast Biopsy  Right. Breast Mass; Local Excision  Bilateral. Hysterectomy (not due to cancer) - Partial   Diagnostic Studies History  Colonoscopy  1-5 years ago Mammogram  1-3 years ago Pap Smear  >5 years ago  Allergies  No Known Drug Allergies   Medication History Aspirin (81MG  Tablet, Oral) Active. Fish Oil (1000MG  Capsule DR, Oral) Active. Meloxicam (15MG  Tablet, Oral) Active. Multi-Vitamin (Oral)  Active. Olmesartan Medoxomil (40MG  Tablet, Oral) Active. hydroCHLOROthiazide (12.5MG  Tablet, Oral) Active. Turmeric (Oral) Specific strength unknown - Active. Zinc (Oral) Specific strength unknown - Active. Vitamin B12 (Oral) Specific strength unknown - Active. Vitamin B6 (Oral) Specific strength unknown - Active. Vitamin D3 (Oral) Specific strength unknown - Active. Vitamin C (Oral) Specific strength unknown - Active. Biotin (Oral) Specific strength unknown - Active. Black Elderberry(Berry-Flower) (575MG  Capsule, Oral) Active. Medications Reconciled  Social History  Alcohol use  Remotely quit alcohol use. Caffeine use  Coffee. No drug use  Tobacco use  Former smoker.  Family History Arthritis  Brother, Mother, Sister. Colon Cancer  Mother. Colon Polyps  Brother. Diabetes Mellitus  Brother, Sister. Hypertension  Brother, Mother. Thyroid problems  Sister.  Pregnancy / Birth History  Age at menarche  23 years. Age of menopause  65-60 Gravida  2 Maternal age  63-20 Para  2  Other Problems Anxiety Disorder  Back Pain  Chest pain  High blood pressure  Thyroid Disease   Review of Systems General Present- Fatigue and Weight Gain. Not Present- Appetite Loss, Chills, Fever, Night Sweats and Weight Loss. Skin Not Present- Change in Wart/Mole, Dryness, Hives, Jaundice, New Lesions, Non-Healing Wounds, Rash and Ulcer. HEENT Present- Seasonal Allergies. Not Present- Earache, Hearing Loss, Hoarseness, Nose Bleed, Oral Ulcers, Ringing in the Ears, Sinus Pain, Sore Throat, Visual Disturbances, Wears glasses/contact lenses and Yellow Eyes. Respiratory Not Present- Bloody sputum, Chronic Cough, Difficulty Breathing, Snoring and Wheezing. Breast Not Present- Breast Mass, Breast Pain, Nipple Discharge and Skin Changes. Cardiovascular Not Present- Chest Pain, Difficulty Breathing Lying Down, Leg Cramps, Palpitations, Rapid Heart Rate, Shortness of Breath and  Swelling of Extremities. Gastrointestinal Not Present- Abdominal Pain, Bloating, Bloody Stool, Change in Bowel Habits, Chronic diarrhea,  Constipation, Difficulty Swallowing, Excessive gas, Gets full quickly at meals, Hemorrhoids, Indigestion, Nausea, Rectal Pain and Vomiting. Female Genitourinary Not Present- Frequency, Nocturia, Painful Urination, Pelvic Pain and Urgency. Musculoskeletal Not Present- Back Pain, Joint Pain, Joint Stiffness, Muscle Pain, Muscle Weakness and Swelling of Extremities. Neurological Not Present- Decreased Memory, Fainting, Headaches, Numbness, Seizures, Tingling, Tremor, Trouble walking and Weakness. Psychiatric Not Present- Anxiety, Bipolar, Change in Sleep Pattern, Depression, Fearful and Frequent crying. Endocrine Not Present- Cold Intolerance, Excessive Hunger, Hair Changes, Heat Intolerance, Hot flashes and New Diabetes. Hematology Not Present- Blood Thinners, Easy Bruising, Excessive bleeding, Gland problems, HIV and Persistent Infections.  Vitals  Weight: 339.13 lb Height: 69in Body Surface Area: 2.59 m Body Mass Index: 50.08 kg/m  Temp.: 97.47F(Tympanic)  Pulse: 97 (Regular)  P.OX: 97% (Room air) BP: 138/84 (Sitting, Left Arm, Standard)   Physical Exam  GENERAL APPEARANCE Development: normal Nutritional status: normal Gross deformities: none  SKIN Rash, lesions, ulcers: none Induration, erythema: none Nodules: none palpable  EYES Conjunctiva and lids: normal Pupils: equal and reactive Iris: normal bilaterally  EARS, NOSE, MOUTH, THROAT External ears: no lesion or deformity External nose: no lesion or deformity Hearing: grossly normal Due to Covid-19 pandemic, patient is wearing a mask.  NECK Symmetric: no Trachea: midline Thyroid: Left thyroid lobe is slightly larger than the right. Both lobes are multinodular. Both extend beneath the clavicle bilaterally. There is no tenderness.  CHEST Respiratory effort:  normal Retraction or accessory muscle use: no Breath sounds: normal bilaterally Rales, rhonchi, wheeze: none  CARDIOVASCULAR Auscultation: regular rhythm, normal rate Murmurs: none Pulses: radial pulse 2+ palpable Lower extremity edema: Mild bilateral  MUSCULOSKELETAL Station and gait: normal Digits and nails: no clubbing or cyanosis Muscle strength: grossly normal all extremities Range of motion: grossly normal all extremities Deformity: none  LYMPHATIC Cervical: none palpable Supraclavicular: none palpable  PSYCHIATRIC Oriented to person, place, and time: yes Mood and affect: normal for situation Judgment and insight: appropriate for situation    Assessment & Plan  MULTIPLE THYROID NODULES (E04.2) ENLARGED THYROID (E04.9) HYPERTHYROIDISM (E05.90)  Patient is referred by Dr. Dagmar Hait, her endocrinologist, for surgical evaluation and management of multinodular thyroid goiter with mild compressive symptoms and hyperthyroidism.  Patient provided with a copy of "The Thyroid Book: Medical and Surgical Treatment of Thyroid Problems", published by [employer], 16 pages. Book reviewed and explained to patient during visit today.  I discussed my recommendation for total thyroidectomy with the patient in detail. We discussed risk and benefits of surgery including the risk of recurrent laryngeal nerve injury and injury to parathyroid glands. We discussed the hospital stay to be anticipated. We discussed the location and cosmetic results from the surgical incision. We discussed the need for lifelong thyroid hormone replacement. The patient understands and wishes to proceed with surgery in the near future.  The risks and benefits of the procedure have been discussed at length with the patient. The patient understands the proposed procedure, potential alternative treatments, and the course of recovery to be expected. All of the patient's questions have been answered at this time.  The patient wishes to proceed with surgery.  Armandina Gemma, MD [facility] Office: [phone]

## 2019-09-01 ENCOUNTER — Ambulatory Visit (HOSPITAL_COMMUNITY): Payer: Medicare Other | Admitting: Registered Nurse

## 2019-09-01 ENCOUNTER — Other Ambulatory Visit: Payer: Self-pay

## 2019-09-01 ENCOUNTER — Encounter (HOSPITAL_COMMUNITY)
Admission: RE | Disposition: A | Discharge status: Home / Self Care | Payer: Self-pay | Source: Ambulatory Visit | Attending: Surgery

## 2019-09-01 ENCOUNTER — Encounter (HOSPITAL_COMMUNITY): Payer: Self-pay | Admitting: Surgery

## 2019-09-01 ENCOUNTER — Ambulatory Visit (HOSPITAL_COMMUNITY): Payer: Medicare Other | Admitting: Physician Assistant

## 2019-09-01 ENCOUNTER — Ambulatory Visit (HOSPITAL_COMMUNITY)
Admission: RE | Admit: 2019-09-01 | Discharge: 2019-09-02 | Disposition: A | Discharge status: Home / Self Care | Payer: Medicare Other | Source: Ambulatory Visit | Attending: Surgery | Admitting: Surgery

## 2019-09-01 DIAGNOSIS — M549 Dorsalgia, unspecified: Secondary | ICD-10-CM | POA: Insufficient documentation

## 2019-09-01 DIAGNOSIS — Z6841 Body Mass Index (BMI) 40.0 and over, adult: Secondary | ICD-10-CM | POA: Diagnosis not present

## 2019-09-01 DIAGNOSIS — E0520 Thyrotoxicosis with toxic multinodular goiter without thyrotoxic crisis or storm: Secondary | ICD-10-CM | POA: Insufficient documentation

## 2019-09-01 DIAGNOSIS — E042 Nontoxic multinodular goiter: Secondary | ICD-10-CM | POA: Diagnosis not present

## 2019-09-01 DIAGNOSIS — C73 Malignant neoplasm of thyroid gland: Secondary | ICD-10-CM | POA: Diagnosis not present

## 2019-09-01 DIAGNOSIS — Z90710 Acquired absence of both cervix and uterus: Secondary | ICD-10-CM | POA: Insufficient documentation

## 2019-09-01 DIAGNOSIS — J45909 Unspecified asthma, uncomplicated: Secondary | ICD-10-CM | POA: Diagnosis not present

## 2019-09-01 DIAGNOSIS — F419 Anxiety disorder, unspecified: Secondary | ICD-10-CM | POA: Insufficient documentation

## 2019-09-01 DIAGNOSIS — Z87891 Personal history of nicotine dependence: Secondary | ICD-10-CM | POA: Insufficient documentation

## 2019-09-01 DIAGNOSIS — E049 Nontoxic goiter, unspecified: Secondary | ICD-10-CM | POA: Diagnosis not present

## 2019-09-01 DIAGNOSIS — E0590 Thyrotoxicosis, unspecified without thyrotoxic crisis or storm: Secondary | ICD-10-CM | POA: Diagnosis not present

## 2019-09-01 DIAGNOSIS — I10 Essential (primary) hypertension: Secondary | ICD-10-CM | POA: Insufficient documentation

## 2019-09-01 HISTORY — PX: THYROIDECTOMY: SHX17

## 2019-09-01 SURGERY — THYROIDECTOMY
Anesthesia: General | Site: Neck

## 2019-09-01 MED ORDER — PROPOFOL 10 MG/ML IV BOLUS
INTRAVENOUS | Status: AC
  Filled 2019-09-01: qty 20

## 2019-09-01 MED ORDER — OXYCODONE HCL 5 MG PO TABS
5.0000 mg | ORAL_TABLET | ORAL | Status: DC | PRN
  Administered 2019-09-01 – 2019-09-02 (×2): 5 mg via ORAL
  Filled 2019-09-01 (×2): qty 1

## 2019-09-01 MED ORDER — MIDAZOLAM HCL 5 MG/5ML IJ SOLN
INTRAMUSCULAR | Status: DC | PRN
  Administered 2019-09-01: 2 mg via INTRAVENOUS

## 2019-09-01 MED ORDER — LIDOCAINE 2% (20 MG/ML) 5 ML SYRINGE
INTRAMUSCULAR | Status: AC
  Filled 2019-09-01: qty 5

## 2019-09-01 MED ORDER — HYDROCHLOROTHIAZIDE 25 MG PO TABS
12.5000 mg | ORAL_TABLET | Freq: Every day | ORAL | Status: DC
  Administered 2019-09-01 – 2019-09-02 (×2): 12.5 mg via ORAL
  Filled 2019-09-01 (×2): qty 1

## 2019-09-01 MED ORDER — ONDANSETRON 4 MG PO TBDP
4.0000 mg | ORAL_TABLET | Freq: Four times a day (QID) | ORAL | Status: DC | PRN
  Filled 2019-09-01: qty 1

## 2019-09-01 MED ORDER — CHLORHEXIDINE GLUCONATE CLOTH 2 % EX PADS
6.0000 | MEDICATED_PAD | Freq: Once | CUTANEOUS | Status: AC
  Administered 2019-09-01: 6 via TOPICAL

## 2019-09-01 MED ORDER — HYDROMORPHONE HCL 1 MG/ML IJ SOLN: 1.0000 mg | INTRAMUSCULAR | Status: DC | PRN

## 2019-09-01 MED ORDER — MELOXICAM 15 MG PO TABS
15.0000 mg | ORAL_TABLET | Freq: Every day | ORAL | Status: DC
  Administered 2019-09-02: 15 mg via ORAL
  Filled 2019-09-01 (×2): qty 1

## 2019-09-01 MED ORDER — SUGAMMADEX SODIUM 200 MG/2ML IV SOLN
INTRAVENOUS | Status: DC | PRN
  Administered 2019-09-01: 500 mg via INTRAVENOUS

## 2019-09-01 MED ORDER — ACETAMINOPHEN 650 MG RE SUPP: 650.0000 mg | Freq: Four times a day (QID) | RECTAL | Status: DC | PRN

## 2019-09-01 MED ORDER — FENTANYL CITRATE (PF) 250 MCG/5ML IJ SOLN
INTRAMUSCULAR | Status: AC
  Filled 2019-09-01: qty 5

## 2019-09-01 MED ORDER — MIDAZOLAM HCL 2 MG/2ML IJ SOLN
INTRAMUSCULAR | Status: AC
  Filled 2019-09-01: qty 2

## 2019-09-01 MED ORDER — FENTANYL CITRATE (PF) 250 MCG/5ML IJ SOLN
INTRAMUSCULAR | Status: DC | PRN
  Administered 2019-09-01: 50 ug via INTRAVENOUS
  Administered 2019-09-01: 100 ug via INTRAVENOUS
  Administered 2019-09-01: 50 ug via INTRAVENOUS

## 2019-09-01 MED ORDER — KCL IN DEXTROSE-NACL 20-5-0.45 MEQ/L-%-% IV SOLN: INTRAVENOUS | Status: DC

## 2019-09-01 MED ORDER — TRAMADOL HCL 50 MG PO TABS
50.0000 mg | ORAL_TABLET | Freq: Four times a day (QID) | ORAL | Status: DC | PRN
  Administered 2019-09-01: 50 mg via ORAL
  Filled 2019-09-01: qty 1

## 2019-09-01 MED ORDER — DEXAMETHASONE SODIUM PHOSPHATE 10 MG/ML IJ SOLN
INTRAMUSCULAR | Status: DC | PRN
  Administered 2019-09-01: 10 mg via INTRAVENOUS

## 2019-09-01 MED ORDER — SUCCINYLCHOLINE CHLORIDE 200 MG/10ML IV SOSY
PREFILLED_SYRINGE | INTRAVENOUS | Status: AC
  Filled 2019-09-01: qty 10

## 2019-09-01 MED ORDER — SUCCINYLCHOLINE CHLORIDE 200 MG/10ML IV SOSY
PREFILLED_SYRINGE | INTRAVENOUS | Status: DC | PRN
  Administered 2019-09-01: 120 mg via INTRAVENOUS

## 2019-09-01 MED ORDER — PHENYLEPHRINE 40 MCG/ML (10ML) SYRINGE FOR IV PUSH (FOR BLOOD PRESSURE SUPPORT)
PREFILLED_SYRINGE | INTRAVENOUS | Status: AC
  Filled 2019-09-01: qty 10

## 2019-09-01 MED ORDER — CALCIUM CARBONATE 1250 (500 CA) MG PO TABS
2.0000 | ORAL_TABLET | Freq: Three times a day (TID) | ORAL | Status: DC
  Administered 2019-09-02 (×2): 1000 mg via ORAL
  Filled 2019-09-01 (×2): qty 1

## 2019-09-01 MED ORDER — PHENYLEPHRINE HCL (PRESSORS) 10 MG/ML IV SOLN
INTRAVENOUS | Status: DC | PRN
  Administered 2019-09-01: 100 ug via INTRAVENOUS
  Administered 2019-09-01: 120 ug via INTRAVENOUS

## 2019-09-01 MED ORDER — ROCURONIUM BROMIDE 10 MG/ML (PF) SYRINGE
PREFILLED_SYRINGE | INTRAVENOUS | Status: AC
  Filled 2019-09-01: qty 10

## 2019-09-01 MED ORDER — ACETAMINOPHEN 325 MG PO TABS
650.0000 mg | ORAL_TABLET | Freq: Four times a day (QID) | ORAL | Status: DC | PRN
  Administered 2019-09-01 – 2019-09-02 (×3): 650 mg via ORAL
  Filled 2019-09-01 (×3): qty 2

## 2019-09-01 MED ORDER — SUGAMMADEX SODIUM 500 MG/5ML IV SOLN
INTRAVENOUS | Status: AC
  Filled 2019-09-01: qty 5

## 2019-09-01 MED ORDER — LIDOCAINE 2% (20 MG/ML) 5 ML SYRINGE
INTRAMUSCULAR | Status: DC | PRN
  Administered 2019-09-01: 50 mg via INTRAVENOUS

## 2019-09-01 MED ORDER — ONDANSETRON HCL 4 MG/2ML IJ SOLN
INTRAMUSCULAR | Status: AC
  Filled 2019-09-01: qty 2

## 2019-09-01 MED ORDER — ROCURONIUM BROMIDE 10 MG/ML (PF) SYRINGE
PREFILLED_SYRINGE | INTRAVENOUS | Status: DC | PRN
  Administered 2019-09-01: 20 mg via INTRAVENOUS
  Administered 2019-09-01: 60 mg via INTRAVENOUS

## 2019-09-01 MED ORDER — 0.9 % SODIUM CHLORIDE (POUR BTL) OPTIME
TOPICAL | Status: DC | PRN
  Administered 2019-09-01: 1000 mL

## 2019-09-01 MED ORDER — ONDANSETRON HCL 4 MG/2ML IJ SOLN: 4.0000 mg | Freq: Four times a day (QID) | INTRAMUSCULAR | Status: DC | PRN

## 2019-09-01 MED ORDER — OXYCODONE HCL 5 MG PO TABS: 5.0000 mg | ORAL_TABLET | Freq: Once | ORAL | Status: DC | PRN

## 2019-09-01 MED ORDER — DEXAMETHASONE SODIUM PHOSPHATE 10 MG/ML IJ SOLN
INTRAMUSCULAR | Status: AC
  Filled 2019-09-01: qty 1

## 2019-09-01 MED ORDER — MONTELUKAST SODIUM 10 MG PO TABS
10.0000 mg | ORAL_TABLET | Freq: Every day | ORAL | Status: DC
  Administered 2019-09-01: 10 mg via ORAL
  Filled 2019-09-01: qty 1

## 2019-09-01 MED ORDER — FENTANYL CITRATE (PF) 100 MCG/2ML IJ SOLN
INTRAMUSCULAR | Status: AC
  Filled 2019-09-01: qty 4

## 2019-09-01 MED ORDER — FENTANYL CITRATE (PF) 100 MCG/2ML IJ SOLN
25.0000 ug | INTRAMUSCULAR | Status: DC | PRN
  Administered 2019-09-01 (×3): 50 ug via INTRAVENOUS

## 2019-09-01 MED ORDER — OXYCODONE HCL 5 MG/5ML PO SOLN: 5.0000 mg | Freq: Once | ORAL | Status: DC | PRN

## 2019-09-01 MED ORDER — IRBESARTAN 150 MG PO TABS
300.0000 mg | ORAL_TABLET | Freq: Every day | ORAL | Status: DC
  Administered 2019-09-01 – 2019-09-02 (×2): 300 mg via ORAL
  Filled 2019-09-01 (×2): qty 2

## 2019-09-01 MED ORDER — ONDANSETRON HCL 4 MG/2ML IJ SOLN
INTRAMUSCULAR | Status: DC | PRN
  Administered 2019-09-01: 4 mg via INTRAVENOUS

## 2019-09-01 MED ORDER — PROPOFOL 10 MG/ML IV BOLUS
INTRAVENOUS | Status: DC | PRN
  Administered 2019-09-01: 40 mg via INTRAVENOUS
  Administered 2019-09-01: 160 mg via INTRAVENOUS

## 2019-09-01 MED ORDER — LACTATED RINGERS IV SOLN: INTRAVENOUS | Status: DC

## 2019-09-01 SURGICAL SUPPLY — 31 items
ADH SKN CLS APL DERMABOND .7 (GAUZE/BANDAGES/DRESSINGS) ×1
APL PRP STRL LF DISP 70% ISPRP (MISCELLANEOUS) ×1
ATTRACTOMAT 16X20 MAGNETIC DRP (DRAPES) ×3 IMPLANT
BLADE SURG 15 STRL LF DISP TIS (BLADE) ×1 IMPLANT
BLADE SURG 15 STRL SS (BLADE) ×3
CHLORAPREP W/TINT 26 (MISCELLANEOUS) ×3 IMPLANT
CLIP VESOCCLUDE MED 6/CT (CLIP) ×14 IMPLANT
CLIP VESOCCLUDE SM WIDE 6/CT (CLIP) ×10 IMPLANT
COVER SURGICAL LIGHT HANDLE (MISCELLANEOUS) ×3 IMPLANT
COVER WAND RF STERILE (DRAPES) ×3 IMPLANT
DERMABOND ADVANCED (GAUZE/BANDAGES/DRESSINGS) ×2
DERMABOND ADVANCED .7 DNX12 (GAUZE/BANDAGES/DRESSINGS) ×1 IMPLANT
DRAPE LAPAROTOMY T 98X78 PEDS (DRAPES) ×3 IMPLANT
ELECT REM PT RETURN 15FT ADLT (MISCELLANEOUS) ×3 IMPLANT
GAUZE 4X4 16PLY RFD (DISPOSABLE) ×3 IMPLANT
GLOVE SURG ORTHO 8.0 STRL STRW (GLOVE) ×3 IMPLANT
GOWN STRL REUS W/TWL XL LVL3 (GOWN DISPOSABLE) ×6 IMPLANT
HEMOSTAT SURGICEL 2X4 FIBR (HEMOSTASIS) ×3 IMPLANT
ILLUMINATOR WAVEGUIDE N/F (MISCELLANEOUS) ×3 IMPLANT
KIT BASIN OR (CUSTOM PROCEDURE TRAY) ×3 IMPLANT
KIT TURNOVER KIT A (KITS) IMPLANT
PACK BASIC VI WITH GOWN DISP (CUSTOM PROCEDURE TRAY) ×3 IMPLANT
PENCIL SMOKE EVACUATOR (MISCELLANEOUS) ×2 IMPLANT
SHEARS HARMONIC 9CM CVD (BLADE) ×3 IMPLANT
SUT MNCRL AB 4-0 PS2 18 (SUTURE) ×3 IMPLANT
SUT VIC AB 3-0 SH 18 (SUTURE) ×6 IMPLANT
SYR BULB IRRIGATION 50ML (SYRINGE) ×3 IMPLANT
TOWEL OR 17X26 10 PK STRL BLUE (TOWEL DISPOSABLE) ×3 IMPLANT
TOWEL OR NON WOVEN STRL DISP B (DISPOSABLE) ×3 IMPLANT
TUBING CONNECTING 10 (TUBING) ×2 IMPLANT
TUBING CONNECTING 10' (TUBING) ×1

## 2019-09-01 NOTE — Plan of Care (Signed)

## 2019-09-01 NOTE — Interval H&P Note (Signed)
History and Physical Interval Note:  09/01/2019 12:54 PM  Donna Cruz  has presented today for surgery, with the diagnosis of MULTINODULAR GOITER, HYPERTHYROIDISM.  The various methods of treatment have been discussed with the patient and family. After consideration of risks, benefits and other options for treatment, the patient has consented to    Procedure(s): TOTAL THYROIDECTOMY (N/A) as a surgical intervention.    The patient's history has been reviewed, patient examined, no change in status, stable for surgery.  I have reviewed the patient's chart and labs.  Questions were answered to the patient's satisfaction.    Armandina Gemma, MD [facility] Office: [place]

## 2019-09-01 NOTE — Op Note (Signed)
Procedure Note  Pre-operative Diagnosis:  Multinodular thyroid goiter, hyperthyroidism  Post-operative Diagnosis:  same  Surgeon:  Armandina Gemma, MD  Assistant:  none   Procedure:  Total thyroidectomy  Anesthesia:  General  Estimated Blood Loss:  minimal  Drains: none         Specimen: thyroid to pathology  Indications:  The patient is referred by Dr. Dagmar Hait for surgical evaluation and management of multinodular thyroid goiter with compressive symptoms and hyperthyroidism. Patient was diagnosed by her primary care provider a few years ago. She has been followed with ultrasound examinations. These show an enlarged thyroid gland with bilateral thyroid nodules. Patient underwent fine-needle aspiration biopsy in 2019 of 2 of these nodules. Both were benign. The patient and I reviewed her ultrasound report and her biopsy results today. Patient has been followed with laboratory studies. She remains hyperthyroid with a suppressed TSH level of 0.02. She is not on thyroid medication or anti-thyroid medication. She has had no prior head or neck surgery. There is a family history of hyperthyroidism in 2 of the patient's sisters. Patient has developed mild compressive symptoms including occasional dysphagia and persistent globus sensation. She discussed management with Dr. Buddy Duty and decided on thyroidectomy for management of multinodular goiter and hyperthyroidism.  Procedure Details: Procedure was done in OR #1 at the [facility]. The patient was brought to the operating room and placed in a supine position on the operating room table. Following administration of general anesthesia, the patient was positioned and then prepped and draped in the usual aseptic fashion. After ascertaining that an adequate level of anesthesia had been achieved, a small Kocher incision was made with #15 blade. Dissection was carried through subcutaneous tissues and platysma.Hemostasis was achieved with the  electrocautery. Skin flaps were elevated cephalad and caudad from the thyroid notch to the sternal notch. A Mahorner self-retaining retractor was placed for exposure. Strap muscles were incised in the midline and dissection was begun on the left side.  Strap muscles were reflected laterally.  Left thyroid lobe was markedly enlarged, multinodular, and extended into the upper mediastinum.  The left lobe was gently mobilized with blunt dissection. Superior pole vessels were dissected out and divided individually between small and medium ligaclips with the harmonic scalpel. The thyroid lobe was rolled anteriorly. Branches of the inferior thyroid artery were divided between small ligaclips with the harmonic scalpel. Inferior venous tributaries were divided between ligaclips. Both the superior and inferior parathyroid glands were identified and preserved on their vascular pedicles. The recurrent laryngeal nerve was identified and preserved along its course. The ligament of Gwenlyn Found was released with the electrocautery and the gland was mobilized onto the anterior trachea. Isthmus was mobilized across the midline. There was a moderate sized pyramidal lobe present which was dissected off of the thyroid cartilage and resected with the isthmus. Dry pack was placed in the left neck.  The right thyroid lobe was gently mobilized with blunt dissection. Right thyroid lobe was moderately enlarged and less nodular. Superior pole vessels were dissected out and divided between small and medium ligaclips with the Harmonic scalpel. Superior parathyroid was identified and preserved. Inferior venous tributaries were divided between medium ligaclips with the harmonic scalpel. The right thyroid lobe was rolled anteriorly and the branches of the inferior thyroid artery divided between small ligaclips. The right recurrent laryngeal nerve was identified and preserved along its course. The ligament of Gwenlyn Found was released with the electrocautery.  The right thyroid lobe was mobilized onto the anterior  trachea and the remainder of the thyroid was dissected off the anterior trachea and the thyroid was completely excised. A suture was used to mark the left lobe. The entire thyroid gland was submitted to pathology for review.  The neck was irrigated with warm saline. Fibrillar was placed throughout the operative field. Strap muscles were approximated in the midline with interrupted 3-0 Vicryl sutures. Platysma was closed with interrupted 3-0 Vicryl sutures. Skin was closed with a running 4-0 Monocryl subcuticular suture. Wound was washed and Dermabond was applied. The patient was awakened from anesthesia and brought to the recovery room. The patient tolerated the procedure well.   Armandina Gemma, MD [facility] Office: [phone]

## 2019-09-01 NOTE — Transfer of Care (Signed)
Immediate Anesthesia Transfer of Care Note  Patient: Ammie Ferrier Simkin  Procedure(s) Performed: TOTAL THYROIDECTOMY (N/A Neck)  Patient Location: PACU  Anesthesia Type:General  Level of Consciousness: awake, alert  and oriented  Airway & Oxygen Therapy: Patient Spontanous Breathing and Patient connected to face mask oxygen  Post-op Assessment: Report given to RN and Post -op Vital signs reviewed and stable  Post vital signs: Reviewed and stable  Last Vitals:  Vitals Value Taken Time  BP 176/100 09/01/19 1521  Temp    Pulse 102 09/01/19 1522  Resp 18 09/01/19 1522  SpO2 98 % 09/01/19 1522  Vitals shown include unvalidated device data.  Last Pain:  Vitals:   09/01/19 1219  TempSrc:   PainSc: 0-No pain         Complications: No apparent anesthesia complications

## 2019-09-01 NOTE — Anesthesia Preprocedure Evaluation (Signed)
Anesthesia Evaluation  Patient identified by MRN, date of birth, ID band Patient awake    Reviewed: Allergy & Precautions, H&P , NPO status , Patient's Chart, lab work & pertinent test results  Airway Mallampati: II   Neck ROM: full    Dental   Pulmonary asthma , former smoker,    breath sounds clear to auscultation       Cardiovascular hypertension,  Rhythm:regular Rate:Normal     Neuro/Psych    GI/Hepatic   Endo/Other  Hyperthyroidism Morbid obesity  Renal/GU      Musculoskeletal   Abdominal   Peds  Hematology   Anesthesia Other Findings   Reproductive/Obstetrics                             Anesthesia Physical Anesthesia Plan  ASA: II  Anesthesia Plan: General   Post-op Pain Management:    Induction: Intravenous  PONV Risk Score and Plan: 3 and Ondansetron, Dexamethasone, Midazolam and Treatment may vary due to age or medical condition  Airway Management Planned: Oral ETT  Additional Equipment:   Intra-op Plan:   Post-operative Plan: Extubation in OR  Informed Consent: I have reviewed the patients History and Physical, chart, labs and discussed the procedure including the risks, benefits and alternatives for the proposed anesthesia with the patient or authorized representative who has indicated his/her understanding and acceptance.       Plan Discussed with: CRNA, Anesthesiologist and Surgeon  Anesthesia Plan Comments:         Anesthesia Quick Evaluation

## 2019-09-01 NOTE — Anesthesia Procedure Notes (Signed)
Procedure Name: Intubation Date/Time: 09/01/2019 1:17 PM Performed by: Talbot Grumbling, CRNA Pre-anesthesia Checklist: Patient identified, Emergency Drugs available, Suction available and Patient being monitored Patient Re-evaluated:Patient Re-evaluated prior to induction Oxygen Delivery Method: Circle system utilized Preoxygenation: Pre-oxygenation with 100% oxygen Induction Type: IV induction Ventilation: Mask ventilation without difficulty Laryngoscope Size: Glidescope (LoPro 4) Grade View: Grade I Tube type: Oral Tube size: 7.5 mm Number of attempts: 1 Airway Equipment and Method: Stylet and Video-laryngoscopy Placement Confirmation: ETT inserted through vocal cords under direct vision,  positive ETCO2 and breath sounds checked- equal and bilateral Secured at: 23 cm Tube secured with: Tape Dental Injury: Teeth and Oropharynx as per pre-operative assessment

## 2019-09-02 ENCOUNTER — Encounter: Payer: Self-pay | Admitting: *Deleted

## 2019-09-02 DIAGNOSIS — E0520 Thyrotoxicosis with toxic multinodular goiter without thyrotoxic crisis or storm: Secondary | ICD-10-CM | POA: Diagnosis not present

## 2019-09-02 DIAGNOSIS — J45909 Unspecified asthma, uncomplicated: Secondary | ICD-10-CM | POA: Diagnosis not present

## 2019-09-02 DIAGNOSIS — C73 Malignant neoplasm of thyroid gland: Secondary | ICD-10-CM | POA: Diagnosis not present

## 2019-09-02 DIAGNOSIS — I10 Essential (primary) hypertension: Secondary | ICD-10-CM | POA: Diagnosis not present

## 2019-09-02 DIAGNOSIS — Z90710 Acquired absence of both cervix and uterus: Secondary | ICD-10-CM | POA: Diagnosis not present

## 2019-09-02 DIAGNOSIS — Z87891 Personal history of nicotine dependence: Secondary | ICD-10-CM | POA: Diagnosis not present

## 2019-09-02 LAB — BASIC METABOLIC PANEL
Anion gap: 9 (ref 5–15)
BUN: 10 mg/dL (ref 8–23)
CO2: 24 mmol/L (ref 22–32)
Calcium: 9.1 mg/dL (ref 8.9–10.3)
Chloride: 103 mmol/L (ref 98–111)
Creatinine, Ser: 0.68 mg/dL (ref 0.44–1.00)
GFR calc Af Amer: >60 mL/min (ref >60)
GFR calc non Af Amer: >60 mL/min (ref >60)
Glucose, Bld: 132 mg/dL — ABNORMAL HIGH (ref 70–99)
Potassium: 4.2 mmol/L (ref 3.5–5.1)
Sodium: 136 mmol/L (ref 135–145)

## 2019-09-02 MED ORDER — CALCIUM CARBONATE ANTACID 500 MG PO CHEW: 2.0000 | CHEWABLE_TABLET | Freq: Two times a day (BID) | ORAL | 1 refills | Status: DC

## 2019-09-02 MED ORDER — TRAMADOL HCL 50 MG PO TABS: 50.0000 mg | ORAL_TABLET | Freq: Four times a day (QID) | ORAL | 0 refills | Status: DC | PRN

## 2019-09-02 NOTE — Progress Notes (Signed)
Patient discharged to home w/ family. Given all belongings, instructions. Verbalized understanding of all instructions. Escorted to pov via w/c. 

## 2019-09-02 NOTE — Discharge Summary (Signed)
Physician Discharge Summary [facility]  Patient ID: Donna Cruz MRN: [id number] DOB/AGE: [birth year] 64 y.o.  Admit date: 09/01/2019 Discharge date: 09/02/2019  Admission Diagnoses:  Hyperthyroidism, enlarged thyroid  Discharge Diagnoses:  Principal Problem:   Enlarged thyroid Active Problems:   Multiple thyroid nodules   Hyperthyroidism   Discharged Condition: good  Hospital Course: Patient was admitted for observation following thyroid surgery.  Post op course was uncomplicated.  Pain was well controlled.  Tolerated diet.  Post op calcium level on morning following surgery was 9.1 mg/dl.  Patient was prepared for discharge home on POD#1.  Consults: None  Treatments: surgery: total thyroidectomy  Discharge Exam: Blood pressure (!) 143/77, pulse 75, temperature 98 F (36.7 C), temperature source Oral, resp. rate 18, height 5\' 9"  (1.753 m), weight (!) 153 kg, SpO2 97 %. HEENT - clear Neck - wound dry and intact; mild STS; voice normal; Dermabond in place Chest - clear bilaterally Cor - RRR  Disposition: Home  Discharge Instructions    Diet - low sodium heart healthy   Complete by: As directed    Discharge instructions   Complete by: As directed    [facility]  THYROID & PARATHYROID SURGERY:  POST-OP INSTRUCTIONS  Always review your discharge instruction sheet from the facility where your surgery was performed.  A prescription for pain medication may be given to you upon discharge.  Take your pain medication as prescribed.  If narcotic pain medicine is not needed, then you may take acetaminophen (Tylenol) or ibuprofen (Advil) as needed.  Take your usually prescribed medications unless otherwise directed.  If you need a refill on your pain medication, please contact our office during regular business hours.  Prescriptions cannot be processed by our office after 5 pm or on weekends.  Start with a light diet upon arrival  home, such as soup and crackers or toast.  Be sure to drink plenty of fluids daily.  Resume your normal diet the day after surgery.  Most patients will experience some swelling and bruising on the chest and neck area.  Ice packs will help.  Swelling and bruising can take several days to resolve.   It is common to experience some constipation after surgery.  Increasing fluid intake and taking a stool softener (Colace) will usually help or prevent this problem.  A mild laxative (Milk of Magnesia or Miralax) should be taken according to package directions if there has been no bowel movement after 48 hours.  You have steri-strips and a gauze dressing over your incision.  You may remove the gauze bandage on the second day after surgery, and you may shower at that time.  Leave your steri-strips (small skin tapes) in place directly over the incision.  These strips should remain on the skin for 5-7 days and then be removed.  You may get them wet in the shower and pat them dry.  You may resume regular (light) daily activities beginning the next day (such as daily self-care, walking, climbing stairs) gradually increasing activities as tolerated.  You may have sexual intercourse when it is comfortable.  Refrain from any heavy lifting or straining until approved by your doctor.  You may drive when you no longer are taking prescription pain medication, you can comfortably wear a seatbelt, and you can safely maneuver your car and apply brakes.  You should see your doctor in the office for a follow-up appointment approximately three weeks after your surgery.  Make  sure that you call for this appointment within a day or two after you arrive home to insure a convenient appointment time.  WHEN TO CALL YOUR DOCTOR: -- Fever greater than 101.5 -- Inability to urinate -- Nausea and/or vomiting - persistent -- Extreme swelling or bruising -- Continued bleeding from incision -- Increased pain, redness, or drainage from  the incision -- Difficulty swallowing or breathing -- Muscle cramping or spasms -- Numbness or tingling in hands or around lips  The clinic staff is available to answer your questions during regular business hours.  Please don't hesitate to call and ask to speak to one of the nurses if you have concerns.  Armandina Gemma, MD [facility] Office: [phone]   Increase activity slowly   Complete by: As directed    No dressing needed   Complete by: As directed      Allergies as of 09/02/2019   No Known Allergies     Medication List    TAKE these medications   ascorbic acid 500 MG tablet Commonly known as: VITAMIN C Take 500 mg by mouth daily.   aspirin EC 81 MG tablet Take 81 mg by mouth daily.   benzonatate 100 MG capsule Commonly known as: TESSALON Take 2 capsules (200 mg total) by mouth 2 (two) times daily as needed for cough.   Biotin 800 MCG Tabs Take 800 mcg by mouth daily.   calcium carbonate 500 MG chewable tablet Commonly known as: Tums Chew 2 tablets (400 mg of elemental calcium total) by mouth 2 (two) times daily.   ELDERBERRY PO Take 1,000 mg by mouth daily.   Fish Oil 1200 MG Caps Take 1,200 mg by mouth daily.   hydrochlorothiazide 12.5 MG tablet Commonly known as: HYDRODIURIL Take 12.5 mg by mouth daily.   HYDROcodone-homatropine 5-1.5 MG/5ML syrup Commonly known as: HYCODAN Take 5 mLs by mouth every 6 (six) hours as needed for cough.   meloxicam 15 MG tablet Commonly known as: MOBIC Take 15 mg by mouth daily.   montelukast 10 MG tablet Commonly known as: SINGULAIR Take 10 mg by mouth at bedtime.   multivitamin with minerals Tabs tablet Take 1 tablet by mouth daily.   olmesartan 40 MG tablet Commonly known as: BENICAR Take 40 mg by mouth daily.   pyridOXINE 100 MG tablet Commonly known as: VITAMIN B-6 Take 100 mg by mouth daily.   traMADol 50 MG tablet Commonly known as: ULTRAM Take 1-2 tablets (50-100 mg total) by  mouth every 6 (six) hours as needed.   TURMERIC PO Take 1 capsule by mouth daily.   vitamin B-12 1000 MCG tablet Commonly known as: CYANOCOBALAMIN Take 1,000 mcg by mouth daily.   Vitamin D3 10 MCG (400 UNIT) Caps Take 400 mg by mouth daily.   vitamin E 180 MG (400 UNITS) capsule Take 400 Units by mouth daily.   zinc gluconate 50 MG tablet Take 50 mg by mouth daily.      Follow-up Information    Armandina Gemma, MD. Schedule an appointment as soon as possible for a visit in 3 week(s).   Specialty: General Surgery Contact information: [address] [place] [address] [phone]           Earnstine Regal, MD, [facility] Office: [phone]   Signed: Armandina Gemma 09/02/2019, 12:59 PM

## 2019-09-03 NOTE — Anesthesia Postprocedure Evaluation (Signed)
Anesthesia Post Note  Patient: Donna Cruz  Procedure(s) Performed: TOTAL THYROIDECTOMY (N/A Neck)     Patient location during evaluation: PACU Anesthesia Type: General Level of consciousness: awake and alert Pain management: pain level controlled Vital Signs Assessment: post-procedure vital signs reviewed and stable Respiratory status: spontaneous breathing, nonlabored ventilation, respiratory function stable and patient connected to nasal cannula oxygen Cardiovascular status: blood pressure returned to baseline and stable Postop Assessment: no apparent nausea or vomiting Anesthetic complications: no    Last Vitals:  Vitals:   09/02/19 0630 09/02/19 1309  BP: (!) 143/77 (!) 155/71  Pulse: 75 70  Resp: 18   Temp: 36.7 C   SpO2: 97%     Last Pain:  Vitals:   09/02/19 0757  TempSrc:   PainSc: 0-No pain                 Suesan Mohrmann S

## 2019-09-04 LAB — SURGICAL PATHOLOGY

## 2019-09-15 DIAGNOSIS — E0590 Thyrotoxicosis, unspecified without thyrotoxic crisis or storm: Secondary | ICD-10-CM | POA: Diagnosis not present

## 2019-09-15 DIAGNOSIS — E042 Nontoxic multinodular goiter: Secondary | ICD-10-CM | POA: Diagnosis not present

## 2019-09-15 DIAGNOSIS — E049 Nontoxic goiter, unspecified: Secondary | ICD-10-CM | POA: Diagnosis not present

## 2019-09-15 DIAGNOSIS — C73 Malignant neoplasm of thyroid gland: Secondary | ICD-10-CM | POA: Diagnosis not present

## 2019-09-18 DIAGNOSIS — Z0000 Encounter for general adult medical examination without abnormal findings: Secondary | ICD-10-CM | POA: Diagnosis not present

## 2019-09-18 DIAGNOSIS — J309 Allergic rhinitis, unspecified: Secondary | ICD-10-CM | POA: Diagnosis not present

## 2019-09-18 DIAGNOSIS — Z79899 Other long term (current) drug therapy: Secondary | ICD-10-CM | POA: Diagnosis not present

## 2019-09-18 DIAGNOSIS — E890 Postprocedural hypothyroidism: Secondary | ICD-10-CM | POA: Diagnosis not present

## 2019-09-22 ENCOUNTER — Other Ambulatory Visit: Payer: Self-pay | Admitting: Family

## 2019-09-22 DIAGNOSIS — E039 Hypothyroidism, unspecified: Secondary | ICD-10-CM

## 2019-09-25 DIAGNOSIS — Z0000 Encounter for general adult medical examination without abnormal findings: Secondary | ICD-10-CM | POA: Diagnosis not present

## 2019-10-06 DIAGNOSIS — E042 Nontoxic multinodular goiter: Secondary | ICD-10-CM | POA: Diagnosis not present

## 2019-10-06 DIAGNOSIS — E0590 Thyrotoxicosis, unspecified without thyrotoxic crisis or storm: Secondary | ICD-10-CM | POA: Diagnosis not present

## 2019-10-07 ENCOUNTER — Other Ambulatory Visit: Payer: Self-pay | Admitting: Family

## 2019-10-07 DIAGNOSIS — Z1231 Encounter for screening mammogram for malignant neoplasm of breast: Secondary | ICD-10-CM

## 2019-10-14 ENCOUNTER — Other Ambulatory Visit: Payer: Self-pay | Admitting: Internal Medicine

## 2019-10-14 DIAGNOSIS — C73 Malignant neoplasm of thyroid gland: Secondary | ICD-10-CM | POA: Diagnosis not present

## 2019-10-14 DIAGNOSIS — E890 Postprocedural hypothyroidism: Secondary | ICD-10-CM

## 2019-10-14 DIAGNOSIS — Z8349 Family history of other endocrine, nutritional and metabolic diseases: Secondary | ICD-10-CM | POA: Diagnosis not present

## 2019-11-03 DIAGNOSIS — W57XXXA Bitten or stung by nonvenomous insect and other nonvenomous arthropods, initial encounter: Secondary | ICD-10-CM | POA: Diagnosis not present

## 2019-11-03 DIAGNOSIS — M1990 Unspecified osteoarthritis, unspecified site: Secondary | ICD-10-CM | POA: Diagnosis not present

## 2019-11-11 ENCOUNTER — Ambulatory Visit
Admission: RE | Admit: 2019-11-11 | Discharge: 2019-11-11 | Disposition: A | Discharge status: Home / Self Care | Payer: Medicare Other | Source: Ambulatory Visit | Attending: Family | Admitting: Family

## 2019-11-11 ENCOUNTER — Other Ambulatory Visit: Payer: Self-pay | Admitting: Family

## 2019-11-11 ENCOUNTER — Ambulatory Visit
Admission: RE | Admit: 2019-11-11 | Discharge: 2019-11-11 | Disposition: A | Discharge status: Home / Self Care | Payer: Medicare Other | Source: Ambulatory Visit | Attending: Internal Medicine | Admitting: Internal Medicine

## 2019-11-11 DIAGNOSIS — E041 Nontoxic single thyroid nodule: Secondary | ICD-10-CM | POA: Diagnosis not present

## 2019-11-11 DIAGNOSIS — M25559 Pain in unspecified hip: Secondary | ICD-10-CM

## 2019-11-11 DIAGNOSIS — M545 Low back pain: Secondary | ICD-10-CM | POA: Diagnosis not present

## 2019-11-11 DIAGNOSIS — M549 Dorsalgia, unspecified: Secondary | ICD-10-CM

## 2019-11-11 DIAGNOSIS — E890 Postprocedural hypothyroidism: Secondary | ICD-10-CM

## 2019-11-11 DIAGNOSIS — C73 Malignant neoplasm of thyroid gland: Secondary | ICD-10-CM

## 2019-12-16 ENCOUNTER — Ambulatory Visit
Admission: RE | Admit: 2019-12-16 | Discharge: 2019-12-16 | Disposition: A | Discharge status: Home / Self Care | Payer: Medicare Other | Source: Ambulatory Visit | Attending: Family | Admitting: Family

## 2019-12-16 ENCOUNTER — Other Ambulatory Visit: Payer: Self-pay

## 2019-12-16 DIAGNOSIS — E039 Hypothyroidism, unspecified: Secondary | ICD-10-CM

## 2019-12-16 DIAGNOSIS — Z780 Asymptomatic menopausal state: Secondary | ICD-10-CM | POA: Diagnosis not present

## 2019-12-16 DIAGNOSIS — Z1231 Encounter for screening mammogram for malignant neoplasm of breast: Secondary | ICD-10-CM | POA: Diagnosis not present

## 2019-12-17 DIAGNOSIS — E890 Postprocedural hypothyroidism: Secondary | ICD-10-CM | POA: Diagnosis not present

## 2019-12-17 DIAGNOSIS — Z8349 Family history of other endocrine, nutritional and metabolic diseases: Secondary | ICD-10-CM | POA: Diagnosis not present

## 2019-12-17 DIAGNOSIS — C73 Malignant neoplasm of thyroid gland: Secondary | ICD-10-CM | POA: Diagnosis not present

## 2020-01-18 ENCOUNTER — Ambulatory Visit
Admission: RE | Admit: 2020-01-18 | Discharge: 2020-01-18 | Disposition: A | Discharge status: Home / Self Care | Payer: Medicare Other | Source: Ambulatory Visit | Attending: Physician Assistant | Admitting: Physician Assistant

## 2020-01-18 ENCOUNTER — Other Ambulatory Visit: Payer: Self-pay

## 2020-01-18 VITALS — BP 149/93 | HR 71 | Temp 98.6°F | Resp 15

## 2020-01-18 DIAGNOSIS — Z20822 Contact with and (suspected) exposure to covid-19: Secondary | ICD-10-CM | POA: Diagnosis not present

## 2020-01-18 NOTE — Discharge Instructions (Signed)

## 2020-01-18 NOTE — ED Triage Notes (Signed)
Patient presents for COVID test due to known positive exposure.

## 2020-01-19 LAB — NOVEL CORONAVIRUS, NAA: SARS-CoV-2, NAA: NOT DETECTED

## 2020-01-19 LAB — SARS-COV-2, NAA 2 DAY TAT

## 2020-03-06 DIAGNOSIS — Z23 Encounter for immunization: Secondary | ICD-10-CM | POA: Diagnosis not present

## 2020-05-03 DIAGNOSIS — J309 Allergic rhinitis, unspecified: Secondary | ICD-10-CM | POA: Diagnosis not present

## 2020-05-03 DIAGNOSIS — I10 Essential (primary) hypertension: Secondary | ICD-10-CM | POA: Diagnosis not present

## 2020-05-04 DIAGNOSIS — C73 Malignant neoplasm of thyroid gland: Secondary | ICD-10-CM | POA: Diagnosis not present

## 2020-05-04 DIAGNOSIS — E890 Postprocedural hypothyroidism: Secondary | ICD-10-CM | POA: Diagnosis not present

## 2020-05-12 DIAGNOSIS — R002 Palpitations: Secondary | ICD-10-CM | POA: Diagnosis not present

## 2020-05-12 DIAGNOSIS — E890 Postprocedural hypothyroidism: Secondary | ICD-10-CM | POA: Diagnosis not present

## 2020-05-12 DIAGNOSIS — G479 Sleep disorder, unspecified: Secondary | ICD-10-CM | POA: Diagnosis not present

## 2020-05-12 DIAGNOSIS — Z85850 Personal history of malignant neoplasm of thyroid: Secondary | ICD-10-CM | POA: Diagnosis not present

## 2020-05-26 DIAGNOSIS — E890 Postprocedural hypothyroidism: Secondary | ICD-10-CM | POA: Diagnosis not present

## 2020-05-26 DIAGNOSIS — C73 Malignant neoplasm of thyroid gland: Secondary | ICD-10-CM | POA: Diagnosis not present

## 2020-07-12 DIAGNOSIS — Z85850 Personal history of malignant neoplasm of thyroid: Secondary | ICD-10-CM | POA: Diagnosis not present

## 2020-07-12 DIAGNOSIS — E890 Postprocedural hypothyroidism: Secondary | ICD-10-CM | POA: Diagnosis not present

## 2020-09-14 DIAGNOSIS — E119 Type 2 diabetes mellitus without complications: Secondary | ICD-10-CM | POA: Diagnosis not present

## 2020-09-14 DIAGNOSIS — E559 Vitamin D deficiency, unspecified: Secondary | ICD-10-CM | POA: Diagnosis not present

## 2020-09-14 DIAGNOSIS — Z0000 Encounter for general adult medical examination without abnormal findings: Secondary | ICD-10-CM | POA: Diagnosis not present

## 2020-09-14 DIAGNOSIS — I10 Essential (primary) hypertension: Secondary | ICD-10-CM | POA: Diagnosis not present

## 2020-09-14 DIAGNOSIS — R7301 Impaired fasting glucose: Secondary | ICD-10-CM | POA: Diagnosis not present

## 2020-09-14 DIAGNOSIS — Z23 Encounter for immunization: Secondary | ICD-10-CM | POA: Diagnosis not present

## 2020-09-14 DIAGNOSIS — M179 Osteoarthritis of knee, unspecified: Secondary | ICD-10-CM | POA: Diagnosis not present

## 2020-09-14 DIAGNOSIS — Z6841 Body Mass Index (BMI) 40.0 and over, adult: Secondary | ICD-10-CM | POA: Diagnosis not present

## 2020-09-23 DIAGNOSIS — E890 Postprocedural hypothyroidism: Secondary | ICD-10-CM | POA: Diagnosis not present

## 2020-10-13 DIAGNOSIS — I10 Essential (primary) hypertension: Secondary | ICD-10-CM | POA: Diagnosis not present

## 2020-10-13 DIAGNOSIS — J019 Acute sinusitis, unspecified: Secondary | ICD-10-CM | POA: Diagnosis not present

## 2020-10-13 DIAGNOSIS — Z20822 Contact with and (suspected) exposure to covid-19: Secondary | ICD-10-CM | POA: Diagnosis not present

## 2020-11-23 DIAGNOSIS — E890 Postprocedural hypothyroidism: Secondary | ICD-10-CM | POA: Diagnosis not present

## 2020-12-30 ENCOUNTER — Other Ambulatory Visit: Payer: Self-pay | Admitting: Nurse Practitioner

## 2020-12-30 DIAGNOSIS — Z1231 Encounter for screening mammogram for malignant neoplasm of breast: Secondary | ICD-10-CM

## 2020-12-31 ENCOUNTER — Other Ambulatory Visit: Payer: Self-pay

## 2020-12-31 ENCOUNTER — Ambulatory Visit
Admission: RE | Admit: 2020-12-31 | Discharge: 2020-12-31 | Disposition: A | Discharge status: Home / Self Care | Payer: Medicare Other | Source: Ambulatory Visit | Attending: Nurse Practitioner | Admitting: Nurse Practitioner

## 2020-12-31 DIAGNOSIS — Z1231 Encounter for screening mammogram for malignant neoplasm of breast: Secondary | ICD-10-CM

## 2021-01-01 DIAGNOSIS — Z23 Encounter for immunization: Secondary | ICD-10-CM | POA: Diagnosis not present

## 2021-01-01 DIAGNOSIS — U071 COVID-19: Secondary | ICD-10-CM | POA: Diagnosis not present

## 2021-02-18 DIAGNOSIS — Z0000 Encounter for general adult medical examination without abnormal findings: Secondary | ICD-10-CM | POA: Diagnosis not present

## 2021-02-18 DIAGNOSIS — M179 Osteoarthritis of knee, unspecified: Secondary | ICD-10-CM | POA: Diagnosis not present

## 2021-02-18 DIAGNOSIS — E039 Hypothyroidism, unspecified: Secondary | ICD-10-CM | POA: Diagnosis not present

## 2021-02-18 DIAGNOSIS — E119 Type 2 diabetes mellitus without complications: Secondary | ICD-10-CM | POA: Diagnosis not present

## 2021-02-18 DIAGNOSIS — Z6841 Body Mass Index (BMI) 40.0 and over, adult: Secondary | ICD-10-CM | POA: Diagnosis not present

## 2021-02-18 DIAGNOSIS — E559 Vitamin D deficiency, unspecified: Secondary | ICD-10-CM | POA: Diagnosis not present

## 2021-02-18 DIAGNOSIS — I10 Essential (primary) hypertension: Secondary | ICD-10-CM | POA: Diagnosis not present

## 2021-02-18 DIAGNOSIS — Z23 Encounter for immunization: Secondary | ICD-10-CM | POA: Diagnosis not present

## 2021-02-18 DIAGNOSIS — Z1159 Encounter for screening for other viral diseases: Secondary | ICD-10-CM | POA: Diagnosis not present

## 2021-03-02 DIAGNOSIS — M25562 Pain in left knee: Secondary | ICD-10-CM | POA: Diagnosis not present

## 2021-05-09 DIAGNOSIS — E890 Postprocedural hypothyroidism: Secondary | ICD-10-CM | POA: Diagnosis not present

## 2021-05-09 DIAGNOSIS — Z85850 Personal history of malignant neoplasm of thyroid: Secondary | ICD-10-CM | POA: Diagnosis not present

## 2021-05-27 DIAGNOSIS — E890 Postprocedural hypothyroidism: Secondary | ICD-10-CM | POA: Diagnosis not present

## 2021-05-27 DIAGNOSIS — C73 Malignant neoplasm of thyroid gland: Secondary | ICD-10-CM | POA: Diagnosis not present

## 2022-02-02 ENCOUNTER — Other Ambulatory Visit: Payer: Self-pay | Admitting: Nurse Practitioner

## 2022-02-02 DIAGNOSIS — Z1231 Encounter for screening mammogram for malignant neoplasm of breast: Secondary | ICD-10-CM

## 2022-02-20 ENCOUNTER — Ambulatory Visit
Admission: RE | Admit: 2022-02-20 | Discharge: 2022-02-20 | Disposition: A | Discharge status: Home / Self Care | Payer: Medicare Other | Source: Ambulatory Visit

## 2022-02-20 DIAGNOSIS — Z1231 Encounter for screening mammogram for malignant neoplasm of breast: Secondary | ICD-10-CM

## 2022-03-16 ENCOUNTER — Ambulatory Visit: Payer: Medicare Other | Admitting: Cardiology

## 2022-03-23 ENCOUNTER — Encounter: Payer: Self-pay | Admitting: Cardiology

## 2022-03-23 ENCOUNTER — Ambulatory Visit: Payer: Medicare Other | Admitting: Cardiology

## 2022-03-23 VITALS — BP 127/81 | HR 76 | Temp 95.5°F | Resp 16 | Ht 69.0 in | Wt 299.8 lb

## 2022-03-23 DIAGNOSIS — E782 Mixed hyperlipidemia: Secondary | ICD-10-CM

## 2022-03-23 DIAGNOSIS — Z8249 Family history of ischemic heart disease and other diseases of the circulatory system: Secondary | ICD-10-CM

## 2022-03-23 DIAGNOSIS — E119 Type 2 diabetes mellitus without complications: Secondary | ICD-10-CM

## 2022-03-23 DIAGNOSIS — I10 Essential (primary) hypertension: Secondary | ICD-10-CM

## 2022-03-23 NOTE — Progress Notes (Signed)
ID:  Donna Cruz, DOB 1955-06-21, MRN [id number]  PCP:  Vonna Drafts, FNP  Cardiologist:  Rex Kras, DO, [facility] (established care 03/23/2022.)  REASON FOR CONSULT: Cardiovascular evaluation (strong family history)  REQUESTING PHYSICIAN:  Suella Broad, [place],   [id number]  Chief Complaint  Patient presents with   Family history of ischemic heart disease   New Patient (Initial Visit)    HPI  Donna Cruz is a 66 y.o. African-American female who presents to the clinic for evaluation of family history of heart disease at the request of Suella Broad,*. Her past medical history and cardiovascular risk factors include: Hyperlipidemia, non-insulin-dependent diabetes mellitus type 2, history of COVID-19 infection.  Patient presents today to be evaluated for possible heart disease given strong family history of CAD.  Patient states that she has a total of 12 siblings including her 68 of which are deceased.  The 5 siblings that have passed are all brothers in their 66s except 88 who is 15 years old.  The most recent demise in the family was in September 2023 (last month).  Patient states that her brother fell couple days before, had an orthopedic injury, had a cast placed.  And when he went to the doctor's office while getting out of the car he passed out and eventually passed away.  They attributed the cause of death to be cardiovascular but no autopsy was performed to rule out pulmonary embolism.  FUNCTIONAL STATUS: No structured exercise program or daily routine.   ALLERGIES: No Known Allergies  MEDICATION LIST PRIOR TO VISIT: Current Meds  Medication Sig   aspirin EC 81 MG tablet Take 81 mg by mouth daily.   cetirizine (ZYRTEC) 10 MG tablet Take 10 mg by mouth daily.   Cholecalciferol (VITAMIN D3) 50 MCG (2000 UT) capsule Take 400 mg by mouth daily.   Cyanocobalamin (VITAMIN B-12 PO) Take 2,500 mcg by mouth daily.    hydrochlorothiazide (HYDRODIURIL) 12.5 MG tablet Take 12.5 mg by mouth daily.   levothyroxine (SYNTHROID) 112 MCG tablet Take 1 tablet by mouth daily at 12 noon.   montelukast (SINGULAIR) 10 MG tablet Take 10 mg by mouth at bedtime.   Multiple Vitamin (MULTIVITAMIN WITH MINERALS) TABS tablet Take 1 tablet by mouth daily.   olmesartan (BENICAR) 40 MG tablet Take 40 mg by mouth daily.   Omega-3 Fatty Acids (FISH OIL) 1200 MG CAPS Take 1,200 mg by mouth daily.   pyridOXINE (VITAMIN B-6) 100 MG tablet Take 100 mg by mouth daily.   rosuvastatin (CRESTOR) 5 MG tablet Take 5 mg by mouth daily.   RYBELSUS 14 MG TABS Take 1 tablet by mouth every morning.   TURMERIC PO Take 1 capsule by mouth daily. 1950 mg     PAST MEDICAL HISTORY: Past Medical History:  Diagnosis Date   Asthma due to seasonal allergies    Hypertension    Pre-diabetes     PAST SURGICAL HISTORY: Past Surgical History:  Procedure Laterality Date   ABDOMINAL HYSTERECTOMY     partial   BREAST EXCISIONAL BIOPSY Bilateral    benign   THYROIDECTOMY N/A 09/01/2019   Procedure: TOTAL THYROIDECTOMY;  Surgeon: Armandina Gemma, MD;  Location: [facility];  Service: General;  Laterality: N/A;    FAMILY HISTORY: The patient family history includes Colon cancer in her mother; Diabetes in her brother, brother, father, mother, sister, sister, sister, and sister; Heart Problems in her brother, brother, brother, brother, brother,  brother, brother, and father; Heart attack in her brother; Heart attack (age of onset: 41) in her sister; Hypertension in her brother, brother, brother, brother, brother, brother, brother, mother, sister, sister, sister, and sister.  SOCIAL HISTORY:  The patient  reports that she quit smoking about 43 years ago. Her smoking use included cigarettes. She has a 10.00 pack-year smoking history. She has never used smokeless tobacco. She reports that she does not drink alcohol and does not use drugs.  REVIEW OF SYSTEMS: Review  of Systems  Cardiovascular:  Negative for chest pain, claudication, dyspnea on exertion, irregular heartbeat, leg swelling, near-syncope, orthopnea, palpitations, paroxysmal nocturnal dyspnea and syncope.  Respiratory:  Negative for shortness of breath.   Hematologic/Lymphatic: Negative for bleeding problem.  Musculoskeletal:  Negative for muscle cramps and myalgias.  Neurological:  Negative for dizziness and light-headedness.    PHYSICAL EXAM:    03/23/2022    9:23 AM 01/18/2020   10:17 AM 09/02/2019    1:09 PM  Vitals with BMI  Height _0     Weight 299 lbs 13 oz    BMI 83.25    Systolic 498 264 158  Diastolic 81 93 71  Pulse 76 71 70   Physical Exam  Constitutional: No distress.  Age appropriate, hemodynamically stable.   Neck: No JVD present.  Cardiovascular: Normal rate, regular rhythm, S1 normal, S2 normal, intact distal pulses and normal pulses. Exam reveals no gallop, no S3 and no S4.  No murmur heard. Pulmonary/Chest: Effort normal and breath sounds normal. No stridor. She has no wheezes. She has no rales.  Abdominal: Soft. Bowel sounds are normal. She exhibits no distension. There is no abdominal tenderness.  Musculoskeletal:        General: No edema.     Cervical back: Neck supple.  Neurological: She is alert and oriented to person, place, and time. She has intact cranial nerves (2-12).  Skin: Skin is warm and moist.   CARDIAC DATABASE: EKG: 03/23/2022: Sinus rhythm, 76 bpm, without underlying ischemia injury pattern.  Echocardiogram: No results found for this or any previous visit from the past 1095 days.   Stress Testing: No results found for this or any previous visit from the past 1095 days.   Heart Catheterization: None  LABORATORY DATA: External Labs: Collected: 02/15/2022. A1c 5.9. Total cholesterol 186, triglycerides 70, HDL 59, LDL 114, BUN 12, creatinine 0.83. eGFR 77. Sodium 140, potassium 4.3, chloride 100, bicarb 24. AST 17, ALT 11,  alkaline phosphatase 71. Hemoglobin 13.9, hematocrit 42%  IMPRESSION:    ICD-10-CM   1. Family history of ischemic heart disease  Z82.49 EKG 12-Lead       RECOMMENDATIONS: Donna Cruz is a 66 y.o. African-American female whose past medical history and cardiac risk factors include: Hyperlipidemia, non-insulin-dependent diabetes mellitus type 2, history of COVID-19 infection.  Family history of ischemic heart disease 5 brothers have passed secondary to heart disease according to the patient. 4 of them were in their 27s and 33 was 68 years old. Coronary calcium score for further risk stratification. Echo will be ordered to evaluate for structural heart disease and left ventricular systolic function. Exercise treadmill stress test to evaluate for functional capacity and exercise-induced ischemia Educated her on the importance of improving her modifiable cardiovascular risk factors.  Benign hypertension Office blood pressures are well controlled. Medications reconciled. No changes warranted at this time  Mixed hyperlipidemia Most recent lipid profile from September 2023 reviewed and noted above. Recently started on Crestor 5 mg  p.o. nightly less than a month ago. Currently managed by primary care provider.  Non-insulin dependent type 2 diabetes mellitus ([place]) Reemphasized importance of glycemic control. Currently on statin therapy, ARB, and Rybelsus. Hemoglobin A1c well controlled.  Class 3 severe obesity due to excess calories with serious comorbidity and body mass index (BMI) of 40.0 to 44.9 in adult [facility]) Body mass index is 44.27 kg/m. I reviewed with the patient the importance of diet, regular physical activity/exercise, weight loss.   Patient is educated on increasing physical activity gradually as tolerated.  With the goal of moderate intensity exercise for 30 minutes a day 5 days a week.  FINAL MEDICATION LIST END OF ENCOUNTER: No orders of the defined types were  placed in this encounter.   Medications Discontinued During This Encounter  Medication Reason   vitamin E 180 MG (400 UNITS) capsule    zinc gluconate 50 MG tablet    traMADol (ULTRAM) 50 MG tablet    meloxicam (MOBIC) 15 MG tablet    HYDROcodone-homatropine (HYCODAN) 5-1.5 MG/5ML syrup    ELDERBERRY PO    calcium carbonate (TUMS) 500 MG chewable tablet    Biotin 800 MCG TABS    benzonatate (TESSALON) 100 MG capsule    ascorbic acid (VITAMIN C) 500 MG tablet      Current Outpatient Medications:    aspirin EC 81 MG tablet, Take 81 mg by mouth daily., Disp: , Rfl:    cetirizine (ZYRTEC) 10 MG tablet, Take 10 mg by mouth daily., Disp: , Rfl:    Cholecalciferol (VITAMIN D3) 50 MCG (2000 UT) capsule, Take 400 mg by mouth daily., Disp: , Rfl:    Cyanocobalamin (VITAMIN B-12 PO), Take 2,500 mcg by mouth daily., Disp: , Rfl:    hydrochlorothiazide (HYDRODIURIL) 12.5 MG tablet, Take 12.5 mg by mouth daily., Disp: , Rfl:    levothyroxine (SYNTHROID) 112 MCG tablet, Take 1 tablet by mouth daily at 12 noon., Disp: , Rfl:    montelukast (SINGULAIR) 10 MG tablet, Take 10 mg by mouth at bedtime., Disp: , Rfl:    Multiple Vitamin (MULTIVITAMIN WITH MINERALS) TABS tablet, Take 1 tablet by mouth daily., Disp: , Rfl:    olmesartan (BENICAR) 40 MG tablet, Take 40 mg by mouth daily., Disp: , Rfl:    Omega-3 Fatty Acids (FISH OIL) 1200 MG CAPS, Take 1,200 mg by mouth daily., Disp: , Rfl:    pyridOXINE (VITAMIN B-6) 100 MG tablet, Take 100 mg by mouth daily., Disp: , Rfl:    rosuvastatin (CRESTOR) 5 MG tablet, Take 5 mg by mouth daily., Disp: , Rfl:    RYBELSUS 14 MG TABS, Take 1 tablet by mouth every morning., Disp: , Rfl:    TURMERIC PO, Take 1 capsule by mouth daily. 1950 mg, Disp: , Rfl:   Orders Placed This Encounter  Procedures   EKG 12-Lead   There are no Patient Instructions on file for this visit.   --Continue cardiac medications as reconciled in final medication list. --No follow-ups on  file. or sooner if needed. --Continue follow-up with your primary care physician regarding the management of your other chronic comorbid conditions.  Patient's questions and concerns were addressed to her satisfaction. She voices understanding of the instructions provided during this encounter.   This note was created using a voice recognition software as a result there may be grammatical errors inadvertently enclosed that do not reflect the nature of this encounter. Every attempt is made to correct such errors.  Tiffony Kite [place], DO, [facility]  Pager: [phone] Office: [phone]

## 2022-03-31 ENCOUNTER — Ambulatory Visit
Admission: RE | Admit: 2022-03-31 | Discharge: 2022-03-31 | Disposition: A | Discharge status: Home / Self Care | Payer: No Typology Code available for payment source | Source: Ambulatory Visit | Attending: Cardiology | Admitting: Cardiology

## 2022-03-31 DIAGNOSIS — Z8249 Family history of ischemic heart disease and other diseases of the circulatory system: Secondary | ICD-10-CM

## 2022-04-06 ENCOUNTER — Telehealth: Payer: Self-pay

## 2022-04-06 NOTE — Telephone Encounter (Signed)
Patient aware.

## 2022-04-06 NOTE — Telephone Encounter (Signed)
Patient called and said she was told she was having an irregular heart beat while having her cardia  ct scan. She was told to let us know . Also she does have occasional palps

## 2022-04-06 NOTE — Telephone Encounter (Signed)
I am unsure what she was told and cannot tell from the CT results. She has an appointment scheduled in December but can make an earlier appt with me to discuss palpitations.

## 2022-04-14 ENCOUNTER — Ambulatory Visit: Payer: Medicare Other

## 2022-04-14 DIAGNOSIS — I10 Essential (primary) hypertension: Secondary | ICD-10-CM

## 2022-04-15 LAB — PCV CARDIAC STRESS TEST
Angina Index: 0
Base ST Depression (mm): 0 mm
ST Depression (mm): 0 mm

## 2022-04-19 NOTE — Progress Notes (Signed)
Called and spoke with patient regarding her CT cardiac scoring results.

## 2022-04-19 NOTE — Progress Notes (Signed)
Called and spoke with patient regarding echocardiogram results.

## 2022-05-24 ENCOUNTER — Encounter: Payer: Self-pay | Admitting: Cardiology

## 2022-05-24 ENCOUNTER — Ambulatory Visit: Payer: Medicare Other | Admitting: Cardiology

## 2022-05-24 VITALS — BP 138/81 | HR 76 | Ht 69.0 in | Wt 298.0 lb

## 2022-05-24 DIAGNOSIS — E782 Mixed hyperlipidemia: Secondary | ICD-10-CM

## 2022-05-24 DIAGNOSIS — E119 Type 2 diabetes mellitus without complications: Secondary | ICD-10-CM

## 2022-05-24 DIAGNOSIS — I700 Atherosclerosis of aorta: Secondary | ICD-10-CM

## 2022-05-24 DIAGNOSIS — I2510 Atherosclerotic heart disease of native coronary artery without angina pectoris: Secondary | ICD-10-CM

## 2022-05-24 DIAGNOSIS — I10 Essential (primary) hypertension: Secondary | ICD-10-CM

## 2022-05-24 DIAGNOSIS — Z8249 Family history of ischemic heart disease and other diseases of the circulatory system: Secondary | ICD-10-CM

## 2022-05-24 NOTE — Progress Notes (Signed)
ID:  Donna Cruz, DOB Nov 19, 1955, MRN [id number]  PCP:  Vonna Drafts, FNP  Cardiologist:  Rex Kras, DO, [facility] (established care 03/23/2022.)  Date: 05/24/22 Last Office Visit: 03/23/2022  Chief Complaint  Patient presents with   Follow-up    Family history of CAD. Discuss test results    HPI  Donna Cruz is a 66 y.o. African-American female whose  past medical history and cardiovascular risk factors include: Moderate coronary artery calcification (243, 89th percentile), aortic atherosclerosis, hyperlipidemia, non-insulin-dependent diabetes mellitus type 2, history of COVID-19 infection.  Patient was referred to the practice for evaluation of CAD given her family history.  She has total of 12 siblings including her.  5 of which have deceased all of them are brothers who passed in their 80s with the exception of 1 brother who is 81 years old.  Most recent demise was in September 2023.  At last office visit the shared decision was to proceed with a coronary calcium score, echo, and GXT.  Clinically she denies anginal discomfort or heart failure symptoms.  Workup illustrates moderate coronary artery calcification with a total CAC of 243 predominantly in the LAD placing her at the 89th percentile, echo notes preserved LVEF with grade 1 diastolic dysfunction, and GXT illustrated poor functional capacity for age, no ST-T changes at peak stress, with occasional PVCs noted during exercise.  FUNCTIONAL STATUS: No structured exercise program or daily routine.   ALLERGIES: No Known Allergies  MEDICATION LIST PRIOR TO VISIT: Current Meds  Medication Sig   aspirin EC 81 MG tablet Take 81 mg by mouth daily.   cetirizine (ZYRTEC) 10 MG tablet Take 10 mg by mouth daily.   Cholecalciferol (VITAMIN D3) 50 MCG (2000 UT) capsule Take 2,000 Units by mouth daily.   Cyanocobalamin (VITAMIN B-12 PO) Take 2,500 mcg by mouth daily.   hydrochlorothiazide (HYDRODIURIL) 12.5 MG tablet  Take 12.5 mg by mouth daily.   levothyroxine (SYNTHROID) 112 MCG tablet Take 1 tablet by mouth daily at 12 noon.   montelukast (SINGULAIR) 10 MG tablet Take 10 mg by mouth at bedtime.   Multiple Vitamin (MULTIVITAMIN WITH MINERALS) TABS tablet Take 1 tablet by mouth daily.   olmesartan (BENICAR) 40 MG tablet Take 40 mg by mouth daily.   Omega-3 Fatty Acids (FISH OIL) 1200 MG CAPS Take 1,200 mg by mouth daily.   pyridOXINE (VITAMIN B-6) 100 MG tablet Take 100 mg by mouth daily.   rosuvastatin (CRESTOR) 5 MG tablet Take 5 mg by mouth daily.   RYBELSUS 14 MG TABS Take 1 tablet by mouth every morning.   TURMERIC PO Take 1 capsule by mouth daily. 1950 mg     PAST MEDICAL HISTORY: Past Medical History:  Diagnosis Date   Asthma due to seasonal allergies    Diabetes mellitus without complication ([place])    Hyperlipidemia    Hypertension     PAST SURGICAL HISTORY: Past Surgical History:  Procedure Laterality Date   ABDOMINAL HYSTERECTOMY     partial   BREAST EXCISIONAL BIOPSY Bilateral    benign   THYROIDECTOMY N/A 09/01/2019   Procedure: TOTAL THYROIDECTOMY;  Surgeon: Armandina Gemma, MD;  Location: [facility];  Service: General;  Laterality: N/A;    FAMILY HISTORY: The patient family history includes Colon cancer in her mother; Diabetes in her brother, brother, father, mother, sister, sister, sister, and sister; Heart Problems in her brother, brother, brother, brother, brother, brother, brother, and father; Heart attack in her brother; Heart attack (age  of onset: 37) in her sister; Hypertension in her brother, brother, brother, brother, brother, brother, brother, mother, sister, sister, sister, and sister.  SOCIAL HISTORY:  The patient  reports that she quit smoking about 44 years ago. Her smoking use included cigarettes. She has a 10.00 pack-year smoking history. She has never used smokeless tobacco. She reports that she does not drink alcohol and does not use drugs.  REVIEW OF SYSTEMS: Review  of Systems  Cardiovascular:  Negative for chest pain, claudication, dyspnea on exertion, irregular heartbeat, leg swelling, near-syncope, orthopnea, palpitations, paroxysmal nocturnal dyspnea and syncope.  Respiratory:  Negative for shortness of breath.   Hematologic/Lymphatic: Negative for bleeding problem.  Musculoskeletal:  Negative for muscle cramps and myalgias.  Neurological:  Negative for dizziness and light-headedness.    PHYSICAL EXAM:    05/24/2022    1:51 PM 03/23/2022    9:23 AM 01/18/2020   10:17 AM  Vitals with BMI  Height 5' 9" 5' 9"   Weight 298 lbs 299 lbs 13 oz   BMI 42.87 68.11   Systolic 572 620 355  Diastolic 81 81 93  Pulse 76 76 71   Physical Exam  Constitutional: No distress.  Age appropriate, hemodynamically stable.   Neck: No JVD present.  Cardiovascular: Normal rate, regular rhythm, S1 normal, S2 normal, intact distal pulses and normal pulses. Exam reveals no gallop, no S3 and no S4.  No murmur heard. Pulmonary/Chest: Effort normal and breath sounds normal. No stridor. She has no wheezes. She has no rales.  Abdominal: Soft. Bowel sounds are normal. She exhibits no distension. There is no abdominal tenderness.  Musculoskeletal:        General: No edema.     Cervical back: Neck supple.  Neurological: She is alert and oriented to person, place, and time. She has intact cranial nerves (2-12).  Skin: Skin is warm and moist.   CARDIAC DATABASE: EKG: 03/23/2022: Sinus rhythm, 76 bpm, without underlying ischemia injury pattern.  Echocardiogram: 04/14/2022: Normal LV systolic function with visual EF 55-60%. Left ventricle cavity is normal in size. Normal global wall motion. Doppler evidence of grade I (impaired) diastolic dysfunction, normal LAP. Calculated EF 55%. Structurally normal tricuspid valve.  Mild tricuspid regurgitation. No evidence of pulmonary hypertension. No prior available for comparison.   Stress Testing: Treadmill Exercise Stress  04/14/2022: The patient exercised for 3 minutes and 4 seconds on Bruce protocol; achieved 4.73 METs at 92% of maximum predicted heart rate. Reduced exercise tolerance. Stress terminated due to THR achieved.  Resting ECG demonstrated normal sinus rhythm.  Peak ECG demonstrated no ST-T wave abnormalities.  Exercise capacity reduced. Chest pain is not present.  Occasional premature ventricular contractions noted on ECG at stress.  The heart rate response was accelerated.  The blood pressure response was normal. Low risk stress test.   Heart Catheterization: None  CT Cardiac Scoring: 03/31/2022 Left main 0. LAD 243. LCx 0. RCA 0. Total CAC 243, 89th percentile Aortic atherosclerosis Noncardiac findings: Small hiatal hernia   LABORATORY DATA: External Labs: Collected: 02/15/2022. A1c 5.9. Total cholesterol 186, triglycerides 70, HDL 59, LDL 114, BUN 12, creatinine 0.83. eGFR 77. Sodium 140, potassium 4.3, chloride 100, bicarb 24. AST 17, ALT 11, alkaline phosphatase 71. Hemoglobin 13.9, hematocrit 42%  IMPRESSION:    ICD-10-CM   1. Coronary atherosclerosis due to calcified coronary lesion  I25.10 PCV MYOCARDIAL PERFUSION WO LEXISCAN   I25.84     2. Atherosclerosis of aorta (HCC)  I70.0     3.  Family history of ischemic heart disease  Z82.49 PCV MYOCARDIAL PERFUSION WO LEXISCAN    4. Benign hypertension  I10     5. Mixed hyperlipidemia  E78.2     6. Non-insulin dependent type 2 diabetes mellitus ([place])  E11.9 PCV MYOCARDIAL PERFUSION WO LEXISCAN    7. Class 3 severe obesity due to excess calories with serious comorbidity and body mass index (BMI) of 40.0 to 44.9 in adult [facility])  E66.01    Z68.41        RECOMMENDATIONS: Donna Cruz is a 66 y.o. African-American female whose past medical history and cardiac risk factors include: Moderate coronary artery calcification (243, 89th percentile), aortic atherosclerosis, hyperlipidemia, non-insulin-dependent diabetes  mellitus type 2, history of COVID-19 infection.  Coronary atherosclerosis due to calcified coronary lesion Total CAC 243, predominantly in the LAD, placing her at the 89th percentile Continue aspirin and statin therapy. Echo: Preserved LVEF, grade 1 diastolic dysfunction, mild TR. GXT: Poor functional capacity for age, stress ECG negative for ischemia, PVCs noted during exercise. Given her strong family history of CAD, diabetes, moderate CAC along with aortic atherosclerosis educated her on importance of improving her modifiable cardiovascular risk factors.  In addition, the shared decision is to proceed with exercise nuclear stress test to evaluate for reversible ischemia.  Would recommend a 2-day protocol.  Further recommendations to follow.  Family history of ischemic heart disease 4 brothers in their 2s passed of cardiac reasons, per patient. 1 brother at the age of 51 passed after surgery -clinical suspicion for PE (no autopsy performed) Has undergone ischemic workup as outlined above. Reemphasized importance of improving her modifiable cardiovascular risk factors.  Benign hypertension Office blood pressures are well-controlled. Medications reconciled. No changes warranted at this time. Currently managed by primary care provider.  Mixed hyperlipidemia Currently on rosuvastatin (started approximately September 2023).   She denies myalgia or other side effects. Recommend a goal LDL of at least <70 mg/dL for reasons mentioned above.   Currently managed by primary care provider & recommended that repeat lipids to be checked and if LDL not at goal to uptitrate statin therapy to maximally tolerated doses.  Non-insulin dependent type 2 diabetes mellitus ([place]) Reemphasized importance of glycemic control. Last hemoglobin A1c from September 2023 well-controlled. Currently on statin therapy, ARB, Rybelsus  Class 3 severe obesity due to excess calories with serious comorbidity and body mass  index (BMI) of 40.0 to 44.9 in adult [facility]) Body mass index is 44.01 kg/m. I reviewed with the patient the importance of diet, regular physical activity/exercise, weight loss.   Patient is educated on increasing physical activity gradually as tolerated.  With the goal of moderate intensity exercise for 30 minutes a day 5 days a week.  FINAL MEDICATION LIST END OF ENCOUNTER: No orders of the defined types were placed in this encounter.   There are no discontinued medications.    Current Outpatient Medications:    aspirin EC 81 MG tablet, Take 81 mg by mouth daily., Disp: , Rfl:    cetirizine (ZYRTEC) 10 MG tablet, Take 10 mg by mouth daily., Disp: , Rfl:    Cholecalciferol (VITAMIN D3) 50 MCG (2000 UT) capsule, Take 2,000 Units by mouth daily., Disp: , Rfl:    Cyanocobalamin (VITAMIN B-12 PO), Take 2,500 mcg by mouth daily., Disp: , Rfl:    hydrochlorothiazide (HYDRODIURIL) 12.5 MG tablet, Take 12.5 mg by mouth daily., Disp: , Rfl:    levothyroxine (SYNTHROID) 112 MCG tablet, Take 1 tablet by mouth daily  at 12 noon., Disp: , Rfl:    montelukast (SINGULAIR) 10 MG tablet, Take 10 mg by mouth at bedtime., Disp: , Rfl:    Multiple Vitamin (MULTIVITAMIN WITH MINERALS) TABS tablet, Take 1 tablet by mouth daily., Disp: , Rfl:    olmesartan (BENICAR) 40 MG tablet, Take 40 mg by mouth daily., Disp: , Rfl:    Omega-3 Fatty Acids (FISH OIL) 1200 MG CAPS, Take 1,200 mg by mouth daily., Disp: , Rfl:    pyridOXINE (VITAMIN B-6) 100 MG tablet, Take 100 mg by mouth daily., Disp: , Rfl:    rosuvastatin (CRESTOR) 5 MG tablet, Take 5 mg by mouth daily., Disp: , Rfl:    RYBELSUS 14 MG TABS, Take 1 tablet by mouth every morning., Disp: , Rfl:    TURMERIC PO, Take 1 capsule by mouth daily. 1950 mg, Disp: , Rfl:   Orders Placed This Encounter  Procedures   PCV MYOCARDIAL PERFUSION WO LEXISCAN   There are no Patient Instructions on file for this visit.   --Continue cardiac medications as reconciled in final  medication list. --Return in about 6 weeks (around 07/05/2022) for Follow up, Review test results. or sooner if needed. --Continue follow-up with your primary care physician regarding the management of your other chronic comorbid conditions.  Patient's questions and concerns were addressed to her satisfaction. She voices understanding of the instructions provided during this encounter.   This note was created using a voice recognition software as a result there may be grammatical errors inadvertently enclosed that do not reflect the nature of this encounter. Every attempt is made to correct such errors.  Rex Kras, [place], [facility]  Pager: [phone] Office: [phone]

## 2022-05-25 ENCOUNTER — Ambulatory Visit: Payer: PRIVATE HEALTH INSURANCE | Admitting: Cardiology

## 2022-06-12 ENCOUNTER — Ambulatory Visit: Payer: Medicare Other

## 2022-06-12 DIAGNOSIS — Z8249 Family history of ischemic heart disease and other diseases of the circulatory system: Secondary | ICD-10-CM

## 2022-06-12 DIAGNOSIS — I2510 Atherosclerotic heart disease of native coronary artery without angina pectoris: Secondary | ICD-10-CM

## 2022-06-12 DIAGNOSIS — E119 Type 2 diabetes mellitus without complications: Secondary | ICD-10-CM

## 2022-06-12 LAB — PCV MYOCARDIAL PERFUSION WO LEXISCAN
Angina Index: 0
ST Depression (mm): 0 mm

## 2022-06-13 ENCOUNTER — Other Ambulatory Visit: Payer: PRIVATE HEALTH INSURANCE

## 2022-06-14 NOTE — Progress Notes (Signed)
Called patient no answer left a vm

## 2022-06-14 NOTE — Progress Notes (Signed)
Called patient, NA, LMAM

## 2022-06-14 NOTE — Progress Notes (Signed)
LMTCB

## 2022-06-15 NOTE — Progress Notes (Signed)
Called patient to inform her about her stress test. Patient understood

## 2022-07-14 ENCOUNTER — Ambulatory Visit: Payer: Medicare Other | Admitting: Cardiology

## 2022-07-14 ENCOUNTER — Encounter: Payer: Self-pay | Admitting: Cardiology

## 2022-07-14 VITALS — BP 138/87 | HR 85 | Resp 16 | Ht 69.0 in | Wt 294.0 lb

## 2022-07-14 DIAGNOSIS — I2510 Atherosclerotic heart disease of native coronary artery without angina pectoris: Secondary | ICD-10-CM

## 2022-07-14 DIAGNOSIS — Z8249 Family history of ischemic heart disease and other diseases of the circulatory system: Secondary | ICD-10-CM

## 2022-07-14 DIAGNOSIS — E119 Type 2 diabetes mellitus without complications: Secondary | ICD-10-CM

## 2022-07-14 DIAGNOSIS — I700 Atherosclerosis of aorta: Secondary | ICD-10-CM

## 2022-07-14 DIAGNOSIS — I10 Essential (primary) hypertension: Secondary | ICD-10-CM

## 2022-07-14 DIAGNOSIS — E782 Mixed hyperlipidemia: Secondary | ICD-10-CM

## 2022-07-14 NOTE — Progress Notes (Signed)
ID:  Donna Cruz, DOB 03/29/1956, MRN [id number]  PCP:  Vonna Drafts, FNP  Cardiologist:  Rex Kras, DO, [facility] (established care 03/23/2022.)  Date: 07/14/22 Last Office Visit: 05/24/2022  Chief Complaint  Patient presents with   Results   Follow-up    6 weeks    HPI  Donna Cruz is a 67 y.o. African-American female whose  past medical history and cardiovascular risk factors include: Moderate coronary artery calcification (243, 89th percentile), aortic atherosclerosis, hyperlipidemia, non-insulin-dependent diabetes mellitus type 2, history of COVID-19 infection.  Referred to the practice for evaluation of CAD given family history. She has total of 12 siblings including her.  5 of which have deceased all of them are brothers who passed in their 39s with the exception of 1 brother who is 51 years old.  She has undergone extensive cardiovascular workup as outlined below and presents today to discuss results.  She denies anginal discomfort or heart failure symptoms.  FUNCTIONAL STATUS: No structured exercise program or daily routine.   ALLERGIES: No Known Allergies  MEDICATION LIST PRIOR TO VISIT: Current Meds  Medication Sig   aspirin EC 81 MG tablet Take 81 mg by mouth daily.   cetirizine (ZYRTEC) 10 MG tablet Take 10 mg by mouth daily.   Cholecalciferol (VITAMIN D3) 50 MCG (2000 UT) capsule Take 2,000 Units by mouth daily.   COLLAGEN PO Take by mouth.   Cyanocobalamin (VITAMIN B-12 PO) Take 2,500 mcg by mouth daily.   hydrochlorothiazide (HYDRODIURIL) 12.5 MG tablet Take 12.5 mg by mouth daily.   levothyroxine (SYNTHROID) 112 MCG tablet Take 1 tablet by mouth daily at 12 noon.   montelukast (SINGULAIR) 10 MG tablet Take 10 mg by mouth at bedtime.   Multiple Vitamin (MULTIVITAMIN WITH MINERALS) TABS tablet Take 1 tablet by mouth daily.   olmesartan (BENICAR) 40 MG tablet Take 40 mg by mouth daily.   Omega-3 Fatty Acids (FISH OIL) 1200 MG CAPS Take 1,200  mg by mouth daily.   pyridOXINE (VITAMIN B-6) 100 MG tablet Take 100 mg by mouth daily.   rosuvastatin (CRESTOR) 5 MG tablet Take 5 mg by mouth daily.   RYBELSUS 14 MG TABS Take 1 tablet by mouth every morning.   TURMERIC PO Take 1 capsule by mouth daily. 1950 mg     PAST MEDICAL HISTORY: Past Medical History:  Diagnosis Date   Asthma due to seasonal allergies    Diabetes mellitus without complication ([place])    Hyperlipidemia    Hypertension     PAST SURGICAL HISTORY: Past Surgical History:  Procedure Laterality Date   ABDOMINAL HYSTERECTOMY     partial   BREAST EXCISIONAL BIOPSY Bilateral    benign   THYROIDECTOMY N/A 09/01/2019   Procedure: TOTAL THYROIDECTOMY;  Surgeon: Armandina Gemma, MD;  Location: [facility];  Service: General;  Laterality: N/A;    FAMILY HISTORY: The patient family history includes Colon cancer in her mother; Diabetes in her brother, brother, father, mother, sister, sister, sister, and sister; Heart Problems in her brother, brother, brother, brother, brother, brother, brother, and father; Heart attack in her brother; Heart attack (age of onset: 68) in her sister; Hypertension in her brother, brother, brother, brother, brother, brother, brother, mother, sister, sister, sister, and sister.  SOCIAL HISTORY:  The patient  reports that she quit smoking about 44 years ago. Her smoking use included cigarettes. She has a 10.00 pack-year smoking history. She has never used smokeless tobacco. She reports that she does not drink  alcohol and does not use drugs.  REVIEW OF SYSTEMS: Review of Systems  Cardiovascular:  Negative for chest pain, claudication, dyspnea on exertion, irregular heartbeat, leg swelling, near-syncope, orthopnea, palpitations, paroxysmal nocturnal dyspnea and syncope.  Respiratory:  Negative for shortness of breath.   Hematologic/Lymphatic: Negative for bleeding problem.  Musculoskeletal:  Negative for muscle cramps and myalgias.  Neurological:  Negative  for dizziness and light-headedness.    PHYSICAL EXAM:    07/14/2022    8:42 AM 05/24/2022    1:51 PM 03/23/2022    9:23 AM  Vitals with BMI  Height 5' 9"$  5' 9"$  5' 9"$   Weight 294 lbs 298 lbs 299 lbs 13 oz  BMI 43.4 [id number] [id number]  Systolic 0000000 0000000 [id number]  Diastolic 87 81 81  Pulse 85 76 76   Physical Exam  Constitutional: No distress.  Age appropriate, hemodynamically stable.   Neck: No JVD present.  Cardiovascular: Normal rate, regular rhythm, S1 normal, S2 normal, intact distal pulses and normal pulses. Exam reveals no gallop, no S3 and no S4.  No murmur heard. Pulmonary/Chest: Effort normal and breath sounds normal. No stridor. She has no wheezes. She has no rales.  Abdominal: Soft. Bowel sounds are normal. She exhibits no distension. There is no abdominal tenderness.  Musculoskeletal:        General: No edema.     Cervical back: Neck supple.  Neurological: She is alert and oriented to person, place, and time. She has intact cranial nerves (2-12).  Skin: Skin is warm and moist.   CARDIAC DATABASE: EKG: 03/23/2022: Sinus rhythm, 76 bpm, without underlying ischemia injury pattern.  Echocardiogram: 04/14/2022: Normal LV systolic function with visual EF 55-60%. Left ventricle cavity is normal in size. Normal global wall motion. Doppler evidence of grade I (impaired) diastolic dysfunction, normal LAP. Calculated EF 55%. Structurally normal tricuspid valve.  Mild tricuspid regurgitation. No evidence of pulmonary hypertension. No prior available for comparison.   Stress Testing: Exercise nuclear stress test 06/12/2022: Myocardial perfusion is normal. Overall LV systolic function is normal without regional wall motion abnormalities. Stress LV EF: 55-60%.  Normal ECG stress. The patient exercised for 1 minutes and 48 seconds of a Bruce protocol, achieving approximately 4.19 METs & 89% MPHR. Markedly reduced exercise tolerance. No chest pain. Stress terminated due to THR achieved.  Normal BP response.  PVC noted during stress and also in initial 2 min of recovery.  No previous exam available for comparison. Low risk.    Heart Catheterization: None  CT Cardiac Scoring: 03/31/2022 Left main 0. LAD 243. LCx 0. RCA 0. Total CAC 243, 89th percentile Aortic atherosclerosis Noncardiac findings: Small hiatal hernia   LABORATORY DATA: External Labs: Collected: 02/15/2022. A1c 5.9. Total cholesterol 186, triglycerides 70, HDL 59, LDL 114, BUN 12, creatinine 0.83. eGFR 77. Sodium 140, potassium 4.3, chloride 100, bicarb 24. AST 17, ALT 11, alkaline phosphatase 71. Hemoglobin 13.9, hematocrit 42%  IMPRESSION:    ICD-10-CM   1. Coronary atherosclerosis due to calcified coronary lesion  I25.10    I25.84     2. Atherosclerosis of aorta (HCC)  I70.0     3. Family history of ischemic heart disease  Z82.49     4. Benign hypertension  I10     5. Mixed hyperlipidemia  E78.2     6. Non-insulin dependent type 2 diabetes mellitus ([place])  E11.9     7. Class 3 severe obesity due to excess calories with serious comorbidity and body mass index (BMI) of 40.0 to  44.9 in adult [facility])  E66.01    Z68.41         RECOMMENDATIONS: BRACIE KNEIP is a 67 y.o. African-American female whose past medical history and cardiac risk factors include: Moderate coronary artery calcification (243, 89th percentile), aortic atherosclerosis, hyperlipidemia, non-insulin-dependent diabetes mellitus type 2, history of COVID-19 infection.  Coronary atherosclerosis due to calcified coronary lesion Total CAC 243, predominantly in the LAD, placing her at the 89th percentile Continue aspirin and statin therapy. Echo: Preserved LVEF, grade 1 diastolic dysfunction, mild TR. GXT: Poor functional capacity for age, stress ECG negative for ischemia, PVCs noted during exercise. Exercise nuclear stress test: Low risk study. Reemphasized importance of improving her modifiable cardiovascular risk  factors. Was encouraged to increase physical activity with a goal of 30 minutes a day 5 days a week as tolerated.  Family history of ischemic heart disease 4 brothers in their 73s passed of cardiac reasons, per patient. 1 brother at the age of 47 passed after surgery -clinical suspicion for PE (no autopsy performed) Ischemic workup as outlined above. No additional cardiovascular testing warranted at this time  Benign hypertension Office blood pressures are well-controlled. Medications reconciled. No changes warranted at this time. Currently managed by primary care provider.  Mixed hyperlipidemia Currently on rosuvastatin (started approximately September 2023).   She denies myalgia or other side effects. Recommend a goal LDL of at least <70 mg/dL and if possible around 55 mg/dL given her CAC, aortic atherosclerosis, and diabetes. If after repeating fasting lipids or LDL are still not at goal recommend up titration of medical therapy. Defer management to PCP at this time  Non-insulin dependent type 2 diabetes mellitus ([place]) Reemphasized importance of glycemic control. Last hemoglobin A1c from September 2023 well-controlled. Currently on statin therapy, ARB, Rybelsus  Class 3 severe obesity due to excess calories with serious comorbidity and body mass index (BMI) of 40.0 to 44.9 in adult [facility]) Body mass index is 43.42 kg/m. I reviewed with the patient the importance of diet, regular physical activity/exercise, weight loss.   Patient is educated on increasing physical activity gradually as tolerated.  With the goal of moderate intensity exercise for 30 minutes a day 5 days a week.  Overall doing well from a cardiovascular standpoint would like to see her back on an annual basis after her yearly well visit with PCP.  She is encouraged to bring labs with her for review/reference.  Sooner if change in clinical status.  Patient is agreeable with the plan of care and is thankful  FINAL  MEDICATION LIST END OF ENCOUNTER: No orders of the defined types were placed in this encounter.   There are no discontinued medications.    Current Outpatient Medications:    aspirin EC 81 MG tablet, Take 81 mg by mouth daily., Disp: , Rfl:    cetirizine (ZYRTEC) 10 MG tablet, Take 10 mg by mouth daily., Disp: , Rfl:    Cholecalciferol (VITAMIN D3) 50 MCG (2000 UT) capsule, Take 2,000 Units by mouth daily., Disp: , Rfl:    COLLAGEN PO, Take by mouth., Disp: , Rfl:    Cyanocobalamin (VITAMIN B-12 PO), Take 2,500 mcg by mouth daily., Disp: , Rfl:    hydrochlorothiazide (HYDRODIURIL) 12.5 MG tablet, Take 12.5 mg by mouth daily., Disp: , Rfl:    levothyroxine (SYNTHROID) 112 MCG tablet, Take 1 tablet by mouth daily at 12 noon., Disp: , Rfl:    montelukast (SINGULAIR) 10 MG tablet, Take 10 mg by mouth at bedtime., Disp: ,  Rfl:    Multiple Vitamin (MULTIVITAMIN WITH MINERALS) TABS tablet, Take 1 tablet by mouth daily., Disp: , Rfl:    olmesartan (BENICAR) 40 MG tablet, Take 40 mg by mouth daily., Disp: , Rfl:    Omega-3 Fatty Acids (FISH OIL) 1200 MG CAPS, Take 1,200 mg by mouth daily., Disp: , Rfl:    pyridOXINE (VITAMIN B-6) 100 MG tablet, Take 100 mg by mouth daily., Disp: , Rfl:    rosuvastatin (CRESTOR) 5 MG tablet, Take 5 mg by mouth daily., Disp: , Rfl:    RYBELSUS 14 MG TABS, Take 1 tablet by mouth every morning., Disp: , Rfl:    TURMERIC PO, Take 1 capsule by mouth daily. 1950 mg, Disp: , Rfl:   No orders of the defined types were placed in this encounter.  There are no Patient Instructions on file for this visit.   --Continue cardiac medications as reconciled in final medication list. --Return in about 8 months (around 03/19/2023) for Follow up, CAD. or sooner if needed. --Continue follow-up with your primary care physician regarding the management of your other chronic comorbid conditions.  Patient's questions and concerns were addressed to her satisfaction. She voices  understanding of the instructions provided during this encounter.   This note was created using a voice recognition software as a result there may be grammatical errors inadvertently enclosed that do not reflect the nature of this encounter. Every attempt is made to correct such errors.  Rex Kras, [place], [facility]  Pager: [phone] Office: [phone]

## 2023-01-23 ENCOUNTER — Other Ambulatory Visit: Payer: Self-pay | Admitting: Nurse Practitioner

## 2023-01-23 DIAGNOSIS — Z0000 Encounter for general adult medical examination without abnormal findings: Secondary | ICD-10-CM

## 2023-01-30 DIAGNOSIS — Z1231 Encounter for screening mammogram for malignant neoplasm of breast: Secondary | ICD-10-CM

## 2023-02-23 ENCOUNTER — Ambulatory Visit: Payer: Medicare Other

## 2023-03-01 ENCOUNTER — Other Ambulatory Visit: Payer: Self-pay | Admitting: Nurse Practitioner

## 2023-03-01 DIAGNOSIS — Z1231 Encounter for screening mammogram for malignant neoplasm of breast: Secondary | ICD-10-CM

## 2023-03-15 ENCOUNTER — Ambulatory Visit: Payer: Self-pay | Admitting: Cardiology

## 2023-03-19 ENCOUNTER — Ambulatory Visit: Payer: Medicare Other | Admitting: Cardiology

## 2023-03-28 DIAGNOSIS — Z1231 Encounter for screening mammogram for malignant neoplasm of breast: Secondary | ICD-10-CM

## 2023-04-24 ENCOUNTER — Ambulatory Visit
Admission: RE | Admit: 2023-04-24 | Discharge: 2023-04-24 | Disposition: A | Discharge status: Home / Self Care | Payer: Medicare Other | Source: Ambulatory Visit | Attending: Nurse Practitioner | Admitting: Nurse Practitioner

## 2023-04-24 DIAGNOSIS — Z1231 Encounter for screening mammogram for malignant neoplasm of breast: Secondary | ICD-10-CM

## 2023-05-28 ENCOUNTER — Ambulatory Visit: Payer: Medicare Other | Attending: Cardiology | Admitting: Cardiology

## 2023-05-28 ENCOUNTER — Encounter: Payer: Self-pay | Admitting: Cardiology

## 2023-05-28 VITALS — BP 130/80 | HR 76 | Resp 16 | Ht 69.0 in | Wt 287.0 lb

## 2023-05-28 DIAGNOSIS — I2510 Atherosclerotic heart disease of native coronary artery without angina pectoris: Secondary | ICD-10-CM | POA: Diagnosis not present

## 2023-05-28 DIAGNOSIS — Z6841 Body Mass Index (BMI) 40.0 and over, adult: Secondary | ICD-10-CM | POA: Insufficient documentation

## 2023-05-28 DIAGNOSIS — I2584 Coronary atherosclerosis due to calcified coronary lesion: Secondary | ICD-10-CM | POA: Insufficient documentation

## 2023-05-28 DIAGNOSIS — Z8249 Family history of ischemic heart disease and other diseases of the circulatory system: Secondary | ICD-10-CM

## 2023-05-28 DIAGNOSIS — E66813 Obesity, class 3: Secondary | ICD-10-CM | POA: Diagnosis present

## 2023-05-28 DIAGNOSIS — R7303 Prediabetes: Secondary | ICD-10-CM

## 2023-05-28 DIAGNOSIS — I10 Essential (primary) hypertension: Secondary | ICD-10-CM

## 2023-05-28 DIAGNOSIS — E782 Mixed hyperlipidemia: Secondary | ICD-10-CM

## 2023-05-28 DIAGNOSIS — I700 Atherosclerosis of aorta: Secondary | ICD-10-CM

## 2023-05-28 NOTE — Progress Notes (Signed)
Cardiology Office Note:  .   Date:  05/28/2023  ID:  Donna Cruz, DOB 05-28-1956, MRN [id number] PCP:  Diamantina Providence, FNP  Former Cardiology Providers: N/A [facility] Providers Cardiologist:  Tessa Lerner, DO , [facility] (established care 03/23/2022) Electrophysiologist:  None  Click to update primary MD,subspecialty MD or APP then REFRESH:1}    Chief Complaint  Patient presents with   Coronary atherosclerosis due to calcified coronary lesion   Follow-up    History of Present Illness: .   Donna Cruz is a 67 y.o. African-American female whose past medical history and cardiovascular risk factors includes: Moderate coronary artery calcification (243, 89th percentile), aortic atherosclerosis, hyperlipidemia, prediabetes, history of COVID-19 infection.   She has a strong family history of CAD who presents today for 1 year follow-up visit. She has total of 12 siblings including her. 5 of which have deceased all of them are brothers who passed in their 70s with the exception of 1 brother who is 15 years old.   Over the last 1 year she denies any anginal chest pain or heart failure symptoms.   Review of Systems: .   Review of Systems  Cardiovascular:  Negative for chest pain, claudication, irregular heartbeat, leg swelling, near-syncope, orthopnea, palpitations, paroxysmal nocturnal dyspnea and syncope.  Respiratory:  Negative for shortness of breath.   Hematologic/Lymphatic: Negative for bleeding problem.    Studies Reviewed:   EKG: EKG Interpretation Date/Time:  Monday May 28 2023 08:46:28 EST Ventricular Rate:  77 PR Interval:  152 QRS Duration:  86 QT Interval:  362 QTC Calculation: 409 R Axis:   52  Text Interpretation: Normal sinus rhythm Normal ECG When compared with ECG of 28-Aug-2019 11:52, No significant change was found Confirmed by Tessa Lerner [phone]) on 05/28/2023 9:08:50 AM  Echocardiogram: 04/14/2022: Normal LV systolic function with  visual EF 55-60%. Left ventricle cavity is normal in size. Normal global wall motion. Doppler evidence of grade I (impaired) diastolic dysfunction, normal LAP. Calculated EF 55%. Structurally normal tricuspid valve.  Mild tricuspid regurgitation. No evidence of pulmonary hypertension. No prior available for comparison.   Stress Testing: Exercise nuclear stress test 06/12/2022: Myocardial perfusion is normal. Overall LV systolic function is normal without regional wall motion abnormalities. Stress LV EF: 55-60%.  Normal ECG stress. The patient exercised for 1 minutes and 48 seconds of a Bruce protocol, achieving approximately 4.19 METs & 89% MPHR. Markedly reduced exercise tolerance. No chest pain. Stress terminated due to THR achieved. Normal BP response.  PVC noted during stress and also in initial 2 min of recovery.  No previous exam available for comparison. Low risk.     Heart Catheterization: None   CT Cardiac Scoring: 03/31/2022 Left main 0. LAD 243. LCx 0. RCA 0. Total CAC 243, 89th percentile Aortic atherosclerosis Noncardiac findings: Small hiatal hernia   RADIOLOGY: NA  Risk Assessment/Calculations:   NA   Labs:    External Labs: Collected: July 2024 available in [employer]. Hemoglobin 13.1, hematocrit 39.7% BUN 13, creatinine 0.84. eGFR 75. Sodium 140, potassium 4.5, chloride 101, bicarb 24. AST, ALT, alkaline phosphatase within normal limits. Total cholesterol 140, triglycerides 56, HDL 63, LDL calculated 65. Hemoglobin A1c 5.8  Physical Exam:    Today's Vitals   05/28/23 0842  BP: 130/80  Pulse: 76  Resp: 16  SpO2: 97%  Weight: 287 lb (130.2 kg)  Height: 5\' 9"  (1.753 m)   Body mass index is 42.38 kg/m. Wt Readings from Last 3 Encounters:  05/28/23 287 lb (130.2 kg)  07/14/22 294 lb (133.4 kg)  05/24/22 298 lb (135.2 kg)    Physical Exam  Constitutional: No distress.  hemodynamically stable  Neck: No JVD present.  Cardiovascular:  Normal rate, regular rhythm, S1 normal, S2 normal, intact distal pulses and normal pulses. Exam reveals no gallop, no S3 and no S4.  No murmur heard. Pulmonary/Chest: Effort normal and breath sounds normal. No stridor. She has no wheezes. She has no rales.  Abdominal: Soft. Bowel sounds are normal. She exhibits no distension. There is no abdominal tenderness.  Musculoskeletal:        General: No edema.     Cervical back: Neck supple.  Neurological: She is alert and oriented to person, place, and time. She has intact cranial nerves (2-12).  Skin: Skin is warm and moist.   Impression & Recommendation(s):  Impression:   ICD-10-CM   1. Coronary atherosclerosis due to calcified coronary lesion  I25.10 EKG 12-Lead   I25.84     2. Atherosclerosis of aorta (HCC)  I70.0     3. Family history of ischemic heart disease  Z82.49     4. Benign hypertension  I10     5. Mixed hyperlipidemia  E78.2     6. Prediabetes  R73.03     7. Class 3 severe obesity due to excess calories with serious comorbidity and body mass index (BMI) of 40.0 to 44.9 in adult [facility])  A54.098    E66.01    Z68.41        Recommendation(s):  Coronary atherosclerosis due to calcified coronary lesion Atherosclerosis of aorta (HCC) Family history of ischemic heart disease Denies anginal chest pain or heart failure symptoms. 4 brothers in their 49s passed of cardiac reasons, per patient. 1 brother at the age of 35 passed after surgery -clinical suspicion for PE (no autopsy performed) Ischemic workup as outlined above. EKG today remains nonischemic Total CAC 243, predominantly in the LAD, placing her at the 89th percentile Continue aspirin and statin therapy. Echo: Preserved LVEF, grade 1 diastolic dysfunction, mild TR. GXT: Poor functional capacity for age, stress ECG negative for ischemia, PVCs noted during exercise. Exercise nuclear stress test: Low risk study. Reemphasized the importance of secondary prevention with  focus on improving her modifiable cardiovascular risk factors such as glycemic control, lipid management, blood pressure control, weight loss.  Benign hypertension Office blood pressures are very well-controlled. Continue hydrochlorothiazide 12.5 mg p.o. daily. Continue olmesartan 40 mg p.o. daily.  Mixed hyperlipidemia Currently on Crestor.   She denies myalgia or other side effects. Most recent lipids dated July 2024, independently reviewed as noted above from [employer] a goal LDL <55 mg/dL.  Patient prefers to work on dietary changes as opposed to up titration of statin therapy at this time.  Agree.  Prediabetes Hemoglobin A1c is well-controlled. Currently on Rybelsus, ARB, statin.  Class 3 severe obesity due to excess calories with serious comorbidity and body mass index (BMI) of 40.0 to 44.9 in adult [facility]) Body mass index is 42.38 kg/m. I reviewed with her importance of diet, regular physical activity/exercise, weight loss.   Patient is educated on the importance of increasing physical activity gradually as tolerated with a goal of moderate intensity exercise for 30 minutes a day 5 days a week.  Orders Placed:  Orders Placed This Encounter  Procedures   EKG 12-Lead    As part of medical decision making prior echo and stress test results, independently reviewed labs from [employer]  from July 2024, EKG.  Final Medication List:   No orders of the defined types were placed in this encounter.   There are no discontinued medications.   Current Outpatient Medications:    aspirin EC 81 MG tablet, Take 81 mg by mouth daily., Disp: , Rfl:    cetirizine (ZYRTEC) 10 MG tablet, Take 10 mg by mouth daily., Disp: , Rfl:    Cholecalciferol (VITAMIN D3) 50 MCG (2000 UT) capsule, Take 2,000 Units by mouth daily., Disp: , Rfl:    COLLAGEN PO, Take by mouth., Disp: , Rfl:    Cyanocobalamin (VITAMIN B-12 PO), Take 2,500 mcg by mouth daily., Disp: , Rfl:     hydrochlorothiazide (HYDRODIURIL) 12.5 MG tablet, Take 12.5 mg by mouth daily., Disp: , Rfl:    levothyroxine (SYNTHROID) 112 MCG tablet, Take 1 tablet by mouth daily at 12 noon., Disp: , Rfl:    montelukast (SINGULAIR) 10 MG tablet, Take 10 mg by mouth at bedtime., Disp: , Rfl:    Multiple Vitamin (MULTIVITAMIN WITH MINERALS) TABS tablet, Take 1 tablet by mouth daily., Disp: , Rfl:    olmesartan (BENICAR) 40 MG tablet, Take 40 mg by mouth daily., Disp: , Rfl:    Omega-3 Fatty Acids (FISH OIL) 1200 MG CAPS, Take 1,200 mg by mouth daily., Disp: , Rfl:    pyridOXINE (VITAMIN B-6) 100 MG tablet, Take 100 mg by mouth daily., Disp: , Rfl:    rosuvastatin (CRESTOR) 5 MG tablet, Take 5 mg by mouth daily., Disp: , Rfl:    RYBELSUS 14 MG TABS, Take 1 tablet by mouth every morning., Disp: , Rfl:    TURMERIC PO, Take 1 capsule by mouth daily. 1950 mg, Disp: , Rfl:   Consent:   None  Disposition:   1 year follow-up sooner if needed.  Her questions and concerns were addressed to her satisfaction. She voices understanding of the recommendations provided during this encounter.    Signed, Tessa Lerner, DO, [facility]  [facility]  [address] [place], [place] [address] 05/28/2023 5:35 PM

## 2023-05-28 NOTE — Patient Instructions (Signed)

## 2023-06-12 ENCOUNTER — Ambulatory Visit: Payer: Medicare Other | Admitting: Cardiology

## 2024-04-11 ENCOUNTER — Other Ambulatory Visit: Payer: Self-pay | Admitting: Nurse Practitioner

## 2024-04-11 DIAGNOSIS — Z1231 Encounter for screening mammogram for malignant neoplasm of breast: Secondary | ICD-10-CM

## 2024-05-02 ENCOUNTER — Ambulatory Visit

## 2024-05-28 ENCOUNTER — Ambulatory Visit
Admission: RE | Admit: 2024-05-28 | Discharge: 2024-05-28 | Disposition: A | Discharge status: Home / Self Care | Source: Ambulatory Visit | Attending: Nurse Practitioner | Admitting: Nurse Practitioner

## 2024-05-28 DIAGNOSIS — Z1231 Encounter for screening mammogram for malignant neoplasm of breast: Secondary | ICD-10-CM
